# Patient Record
Sex: Male | Born: 1945
Health system: Southern US, Community
[De-identification: ages and names within clinical notes are randomized; demographics above are authoritative.]

## PROBLEM LIST (undated history)

## (undated) DIAGNOSIS — E7800 Pure hypercholesterolemia, unspecified: Secondary | ICD-10-CM

## (undated) DIAGNOSIS — I7409 Other arterial embolism and thrombosis of abdominal aorta: Secondary | ICD-10-CM

## (undated) DIAGNOSIS — J189 Pneumonia, unspecified organism: Secondary | ICD-10-CM

## (undated) DIAGNOSIS — I10 Essential (primary) hypertension: Secondary | ICD-10-CM

## (undated) DIAGNOSIS — I739 Peripheral vascular disease, unspecified: Secondary | ICD-10-CM

## (undated) DIAGNOSIS — J869 Pyothorax without fistula: Secondary | ICD-10-CM

## (undated) DIAGNOSIS — E785 Hyperlipidemia, unspecified: Secondary | ICD-10-CM

## (undated) DIAGNOSIS — J449 Chronic obstructive pulmonary disease, unspecified: Secondary | ICD-10-CM

## (undated) DIAGNOSIS — I779 Disorder of arteries and arterioles, unspecified: Secondary | ICD-10-CM

## (undated) DIAGNOSIS — Z720 Tobacco use: Secondary | ICD-10-CM

## (undated) DIAGNOSIS — I2510 Atherosclerotic heart disease of native coronary artery without angina pectoris: Secondary | ICD-10-CM

## (undated) HISTORY — DX: Disorder of arteries and arterioles, unspecified: I77.9

## (undated) HISTORY — DX: Tobacco use: Z72.0

## (undated) HISTORY — PX: OTHER SURGICAL HISTORY: SHX169

## (undated) HISTORY — PX: DOPPLER ECHOCARDIOGRAPHY: SHX263

## (undated) HISTORY — DX: Hyperlipidemia, unspecified: E78.5

## (undated) HISTORY — PX: HERNIA REPAIR: SHX51

## (undated) HISTORY — PX: COLONOSCOPY W/ POLYPECTOMY: SHX1380

## (undated) HISTORY — DX: Pure hypercholesterolemia, unspecified: E78.00

## (undated) HISTORY — DX: Essential (primary) hypertension: I10

## (undated) HISTORY — DX: Other arterial embolism and thrombosis of abdominal aorta: I74.09

## (undated) HISTORY — PX: TONSILLECTOMY AND ADENOIDECTOMY: SUR1326

## (undated) HISTORY — DX: Peripheral vascular disease, unspecified: I73.9

---

## 2010-03-23 HISTORY — PX: NM MYOVIEW LTD: HXRAD82

## 2011-02-01 ENCOUNTER — Ambulatory Visit
Admission: RE | Admit: 2011-02-01 | Discharge: 2011-02-01 | Disposition: A | Discharge status: Home / Self Care | Payer: Medicare Other | Source: Ambulatory Visit | Attending: Family Medicine | Admitting: Family Medicine

## 2011-02-01 ENCOUNTER — Other Ambulatory Visit: Payer: Self-pay | Admitting: Family Medicine

## 2011-02-01 DIAGNOSIS — R42 Dizziness and giddiness: Secondary | ICD-10-CM

## 2011-02-01 DIAGNOSIS — R270 Ataxia, unspecified: Secondary | ICD-10-CM

## 2013-01-27 ENCOUNTER — Other Ambulatory Visit (HOSPITAL_COMMUNITY): Payer: Self-pay | Admitting: Cardiovascular Disease

## 2013-01-27 DIAGNOSIS — I6529 Occlusion and stenosis of unspecified carotid artery: Secondary | ICD-10-CM

## 2013-02-04 ENCOUNTER — Ambulatory Visit (HOSPITAL_COMMUNITY)
Admission: RE | Admit: 2013-02-04 | Discharge: 2013-02-04 | Disposition: A | Discharge status: Home / Self Care | Payer: Medicare Other | Source: Ambulatory Visit | Attending: Cardiology | Admitting: Cardiology

## 2013-02-04 DIAGNOSIS — I6529 Occlusion and stenosis of unspecified carotid artery: Secondary | ICD-10-CM | POA: Insufficient documentation

## 2013-02-04 NOTE — Progress Notes (Signed)
Carotid Duplex Completed. °Brianna L Mazza,RVT °

## 2013-02-14 ENCOUNTER — Encounter: Payer: Self-pay | Admitting: *Deleted

## 2013-02-14 ENCOUNTER — Telehealth: Payer: Self-pay | Admitting: *Deleted

## 2013-02-14 DIAGNOSIS — I6529 Occlusion and stenosis of unspecified carotid artery: Secondary | ICD-10-CM

## 2013-02-14 NOTE — Telephone Encounter (Signed)
Order placed for repeat carotid dopplers in 1 year  

## 2013-02-14 NOTE — Telephone Encounter (Signed)
Message copied by Marella Bile on Sat Feb 14, 2013  1:27 PM ------      Message from: Runell Gess      Created: Wed Feb 11, 2013  8:40 PM       No change from prior study. Repeat in 12 months. ------

## 2013-03-25 ENCOUNTER — Ambulatory Visit: Payer: Medicare Other | Admitting: Cardiovascular Disease

## 2013-04-09 ENCOUNTER — Encounter: Payer: Self-pay | Admitting: Cardiovascular Disease

## 2013-04-09 ENCOUNTER — Ambulatory Visit (INDEPENDENT_AMBULATORY_CARE_PROVIDER_SITE_OTHER): Payer: Medicare Other | Admitting: Cardiovascular Disease

## 2013-04-09 VITALS — BP 96/70 | HR 84 | Ht 74.0 in | Wt 201.0 lb

## 2013-04-09 DIAGNOSIS — E785 Hyperlipidemia, unspecified: Secondary | ICD-10-CM

## 2013-04-09 DIAGNOSIS — I10 Essential (primary) hypertension: Secondary | ICD-10-CM

## 2013-04-09 DIAGNOSIS — Z720 Tobacco use: Secondary | ICD-10-CM

## 2013-04-09 DIAGNOSIS — F17210 Nicotine dependence, cigarettes, uncomplicated: Secondary | ICD-10-CM | POA: Insufficient documentation

## 2013-04-09 DIAGNOSIS — I779 Disorder of arteries and arterioles, unspecified: Secondary | ICD-10-CM | POA: Insufficient documentation

## 2013-04-09 DIAGNOSIS — Z79899 Other long term (current) drug therapy: Secondary | ICD-10-CM

## 2013-04-09 DIAGNOSIS — F17200 Nicotine dependence, unspecified, uncomplicated: Secondary | ICD-10-CM

## 2013-04-09 NOTE — Assessment & Plan Note (Signed)
On statin therapy. We'll recheck a lipid and liver profile 

## 2013-04-09 NOTE — Progress Notes (Signed)
04/09/2013 Brent Gonzales   January 25, 1946  [id number]  Primary Physician Brent Grosser, MD Primary Cardiologist: Brent Gess MD Brent Gonzales   HPI: The patient is a 67 year old, thin-appearing, married Caucasian male with no children who I last saw a year ago. He has a history of continued tobacco abuse 1/2 pack per day although he is trying to cut this down and has transitioned to an E. cigarette, treated hypertension and hyperlipidemia. He has moderate carotid disease with a right ICA stenosis that has remained stable by ultrasound as recently as July. He also has mild bilateral external iliac disease by duplex ultrasound with ABIs in the 0.9 range, though, he denies claudication. Myoview performed December 2011 showed inferior septal ischemia that we decided to treat conservatively since he is asymptomatic and Dr. Tanya Gonzales follows his lipid profile. He has had not chest or shortness of breath since I last saw him.    Current Outpatient Prescriptions  Medication Sig Dispense Refill  . aspirin 81 MG tablet Take 81 mg by mouth daily.      . Cholecalciferol (VITAMIN D) 2000 UNITS CAPS Take 2,000 Units by mouth daily.      . clopidogrel (PLAVIX) 75 MG tablet Take 75 mg by mouth daily with breakfast.      . Coenzyme Q10 (CO Q10) 100 MG CAPS Take 200 mg by mouth daily.      Marland Kitchen FLUZONE HIGH-DOSE injection       . glucosamine-chondroitin 500-400 MG tablet Take 1 tablet by mouth 2 (two) times daily.      . Multiple Vitamin (MULTIVITAMIN) capsule Take 1 capsule by mouth daily.      . Omega-3 Fatty Acids (FISH OIL CONCENTRATE PO) Take 1,000 mg by mouth 2 (two) times daily.      . pravastatin (PRAVACHOL) 40 MG tablet Take 40 mg by mouth daily.       No current facility-administered medications for this visit.    Allergies  Allergen Reactions  . Penicillins     History   Social History  . Marital Status: Married    Spouse Name: N/A    Number of Children: N/A  . Years  of Education: N/A   Occupational History  . Not on file.   Social History Main Topics  . Smoking status: Current Every Day Smoker    Types: Cigarettes  . Smokeless tobacco: Not on file     Comment: weaning off cigs and using the e cig  . Alcohol Use: Not on file  . Drug Use: Not on file  . Sexual Activity: Not on file   Other Topics Concern  . Not on file   Social History Narrative  . No narrative on file     Review of Systems: General: negative for chills, fever, night sweats or weight changes.  Cardiovascular: negative for chest pain, dyspnea on exertion, edema, orthopnea, palpitations, paroxysmal nocturnal dyspnea or shortness of breath Dermatological: negative for rash Respiratory: negative for cough or wheezing Urologic: negative for hematuria Abdominal: negative for nausea, vomiting, diarrhea, bright red blood per rectum, melena, or hematemesis Neurologic: negative for visual changes, syncope, or dizziness All other systems reviewed and are otherwise negative except as noted above.    Blood pressure 96/70, pulse 84, height 6\' 2"  (1.88 m), weight 201 lb (91.173 kg).  General appearance: alert and no distress Neck: no adenopathy, no JVD, supple, symmetrical, trachea midline, thyroid not enlarged, symmetric, no tenderness/mass/nodules and soft right carotid bruit Lungs: clear to  auscultation bilaterally Heart: regular rate and rhythm, S1, S2 normal, no murmur, click, rub or gallop Extremities: extremities normal, atraumatic, no cyanosis or edema  EKG normal sinus rhythm at 84 without ST or T wave changes.  ASSESSMENT AND PLAN:   Carotid artery disease Followed by duplex ultrasound in our office. This was most recently performed 02/04/13 revealing moderate right and mild left ICA stenosis unchanged from the prior year. He is neurologically asymptomatic. We will continue to follow this on an annual basis.  Essential hypertension Controlled on current  medications  Hyperlipidemia On statin therapy. We'll recheck a lipid and liver profile      Brent Gess MD [place], [facility] 04/09/2013 10:13 AM Brent Gess MD FACP,FACC,FAHA, [facility]

## 2013-04-09 NOTE — Assessment & Plan Note (Signed)
Controlled on current medications 

## 2013-04-09 NOTE — Patient Instructions (Signed)
  We will see you back in follow up in 1 year with Dr Berry  Dr Berry has ordered blood work to be done fasting.    

## 2013-04-09 NOTE — Assessment & Plan Note (Signed)
Followed by duplex ultrasound in our office. This was most recently performed 02/04/13 revealing moderate right and mild left ICA stenosis unchanged from the prior year. He is neurologically asymptomatic. We will continue to follow this on an annual basis.

## 2013-04-10 ENCOUNTER — Encounter: Payer: Self-pay | Admitting: Cardiovascular Disease

## 2013-05-13 ENCOUNTER — Other Ambulatory Visit: Payer: Self-pay | Admitting: Cardiovascular Disease

## 2013-05-18 ENCOUNTER — Other Ambulatory Visit: Payer: Medicare Other

## 2013-05-18 ENCOUNTER — Other Ambulatory Visit: Payer: Self-pay | Admitting: *Deleted

## 2013-05-18 LAB — HEPATIC FUNCTION PANEL
ALT: 31 U/L (ref 0–53)
AST: 28 U/L (ref 0–37)
Albumin: 4.4 g/dL (ref 3.5–5.2)
Alkaline Phosphatase: 54 U/L (ref 39–117)
Bilirubin, Direct: 0.1 mg/dL (ref 0.0–0.3)
Indirect Bilirubin: 0.4 mg/dL (ref 0.0–0.9)
Total Bilirubin: 0.5 mg/dL (ref 0.3–1.2)
Total Protein: 7.2 g/dL (ref 6.0–8.3)

## 2013-05-18 LAB — LIPID PANEL
Cholesterol: 154 mg/dL (ref 0–200)
HDL: 39 mg/dL — ABNORMAL LOW (ref >39)
LDL Cholesterol: 81 mg/dL (ref 0–99)
Total CHOL/HDL Ratio: 3.9 Ratio
Triglycerides: 168 mg/dL — ABNORMAL HIGH (ref <150)
VLDL: 34 mg/dL (ref 0–40)

## 2013-05-18 MED ORDER — CLOPIDOGREL BISULFATE 75 MG PO TABS: 75.0000 mg | ORAL_TABLET | Freq: Every day | ORAL | Status: DC

## 2013-05-18 NOTE — Telephone Encounter (Signed)
Rx was sent to pharmacy electronically. 

## 2013-05-20 ENCOUNTER — Encounter: Payer: Self-pay | Admitting: *Deleted

## 2013-05-25 ENCOUNTER — Ambulatory Visit (INDEPENDENT_AMBULATORY_CARE_PROVIDER_SITE_OTHER): Payer: Medicare Other | Admitting: Family Medicine

## 2013-05-25 ENCOUNTER — Encounter: Payer: Self-pay | Admitting: Family Medicine

## 2013-05-25 VITALS — BP 100/62 | HR 78 | Temp 98.7°F | Resp 16 | Ht 74.0 in | Wt 206.0 lb

## 2013-05-25 DIAGNOSIS — Z23 Encounter for immunization: Secondary | ICD-10-CM

## 2013-05-25 DIAGNOSIS — Z0000 Encounter for general adult medical examination without abnormal findings: Secondary | ICD-10-CM

## 2013-05-25 DIAGNOSIS — H269 Unspecified cataract: Secondary | ICD-10-CM

## 2013-05-25 MED ORDER — ATORVASTATIN CALCIUM 40 MG PO TABS: 40.0000 mg | ORAL_TABLET | Freq: Every day | ORAL | Status: DC

## 2013-05-25 NOTE — Progress Notes (Signed)
Subjective:    Patient ID: Brent Gonzales, male    DOB: [birth year], 68 y.o.   MRN: [id number]  HPI  Patient is a very pleasant 68 year old Caucasian male who comes in today for complete physical exam. Past medical history is significant for ASCVD. He had a positive Myoview showing septal ischemia in 2011. The patient is currently asymptomatic and we have elected to pursue medical management. He continues to deny chest pain or dyspnea on exertion. Unfortunately continues to smoke. His most recent fasting lipid panel revealed an LDL greater than 80. His goal LDL be less than 70. He is currently taking pravastatin 40 mg by mouth daily without any side effects. He also complains of blurry vision in his left eye. It is worse at night. He is also having problems with distance vision. Objects are very blurry in the left eye. Examination today reveals a probable cataract. As his last colonoscopy was in 2012. He has history of colon polyps. He is next due for colonoscopy in 2017. Patient has had Pneumovax in 2010. He bone density in 2009. He has had Zostavax. He is due for a tetanus vaccine but declines it based on insurance coverage. He is also due for Prevnar 13. Past Medical History  Diagnosis Date  . Hyperlipidemia   . Tobacco abuse   . Carotid artery disease   . Hypertension    No past surgical history on file. Past surgical history is significant for hernia repair in 1958 and 1978 Current Outpatient Prescriptions on File Prior to Visit  Medication Sig Dispense Refill  . aspirin 81 MG tablet Take 81 mg by mouth daily.      . Cholecalciferol (VITAMIN D) 2000 UNITS CAPS Take 2,000 Units by mouth daily.      . clopidogrel (PLAVIX) 75 MG tablet Take 1 tablet (75 mg total) by mouth daily with breakfast.  360 tablet  0  . Coenzyme Q10 (CO Q10) 100 MG CAPS Take 200 mg by mouth daily.      Marland Kitchen glucosamine-chondroitin 500-400 MG tablet Take 1 tablet by mouth 2 (two) times daily.      . Multiple Vitamin  (MULTIVITAMIN) capsule Take 1 capsule by mouth daily.      . Omega-3 Fatty Acids (FISH OIL CONCENTRATE PO) Take 1,000 mg by mouth 2 (two) times daily.       No current facility-administered medications on file prior to visit.   Allergies  Allergen Reactions  . Penicillins    History   Social History  . Marital Status: Married    Spouse Name: N/A    Number of Children: N/A  . Years of Education: N/A   Occupational History  . Not on file.   Social History Main Topics  . Smoking status: Current Every Day Smoker    Types: Cigarettes  . Smokeless tobacco: Not on file     Comment: weaning off cigs and using the e cig  . Alcohol Use: Not on file  . Drug Use: Not on file  . Sexual Activity: Not on file   Other Topics Concern  . Not on file   Social History Narrative  . No narrative on file   No family history on file.   Review of Systems  All other systems reviewed and are negative.       Objective:   Physical Exam  Vitals reviewed. Constitutional: He is oriented to person, place, and time. He appears well-developed and well-nourished. No distress.  HENT:  Head: Normocephalic  and atraumatic.  Right Ear: External ear normal.  Left Ear: External ear normal.  Nose: Nose normal.  Mouth/Throat: Oropharynx is clear and moist. No oropharyngeal exudate.  Eyes: Conjunctivae and EOM are normal. Pupils are equal, round, and reactive to light. Right eye exhibits no discharge. Left eye exhibits no discharge. No scleral icterus.  Neck: Normal range of motion. Neck supple. No JVD present. No tracheal deviation present. No thyromegaly present.  Cardiovascular: Normal rate, regular rhythm and intact distal pulses.  Exam reveals no gallop and no friction rub.   No murmur heard. Pulmonary/Chest: Effort normal and breath sounds normal. No stridor. No respiratory distress. He has no wheezes. He has no rales. He exhibits no tenderness.  Abdominal: Soft. Bowel sounds are normal. He  exhibits no distension and no mass. There is no tenderness. There is no rebound and no guarding.  Genitourinary: Rectum normal, prostate normal and penis normal. Guaiac negative stool. No penile tenderness.  Musculoskeletal: Normal range of motion. He exhibits no edema and no tenderness.  Lymphadenopathy:    He has no cervical adenopathy.  Neurological: He is alert and oriented to person, place, and time. He has normal reflexes. He displays normal reflexes. No cranial nerve deficit. He exhibits normal muscle tone. Coordination normal.  Skin: Skin is warm. No rash noted. He is not diaphoretic. No erythema. No pallor.  Psychiatric: He has a normal mood and affect. His behavior is normal. Judgment and thought content normal.          Assessment & Plan:  1. Routine general medical examination at a health care facility I will check a CBC and BMP and PSA. His other cancer screening is up to date. The patient received Prevnar 13 today in the office. I did recommend discontinuing pravastatin and replacing it with Lipitor 40 mg by mouth daily to try to get his goal LDL below 70.  I also recommended smoking cessation. I may try the patient on trying to cut back on his cigarette use. Discussed the risk of nicotine and cardiovascular disease. - CBC with Differential - BASIC METABOLIC PANEL WITH GFR - PSA, Medicare  2. Left cataract I will consult ophthalmology regarding the left eye cataract. - Ambulatory referral to Ophthalmology

## 2013-05-25 NOTE — Addendum Note (Signed)
Addended by: Shary Decamp B on: 05/25/2013 05:23 PM   Modules accepted: Orders

## 2013-05-26 LAB — BASIC METABOLIC PANEL WITH GFR
BUN: 19 mg/dL (ref 6–23)
CO2: 23 meq/L (ref 19–32)
Calcium: 10.2 mg/dL (ref 8.4–10.5)
Chloride: 102 mEq/L (ref 96–112)
Creat: 1.12 mg/dL (ref 0.50–1.35)
GFR, Est African American: 78 mL/min
GFR, Est Non African American: 67 mL/min
GLUCOSE: 89 mg/dL (ref 70–99)
POTASSIUM: 4.4 meq/L (ref 3.5–5.3)
SODIUM: 140 meq/L (ref 135–145)

## 2013-05-26 LAB — CBC WITH DIFFERENTIAL/PLATELET
Basophils Absolute: 0 10*3/uL (ref 0.0–0.1)
Basophils Relative: 0 % (ref 0–1)
Eosinophils Absolute: 0.3 10*3/uL (ref 0.0–0.7)
Eosinophils Relative: 4 % (ref 0–5)
HCT: 46.8 % (ref 39.0–52.0)
HEMOGLOBIN: 16.4 g/dL (ref 13.0–17.0)
LYMPHS ABS: 2.1 10*3/uL (ref 0.7–4.0)
Lymphocytes Relative: 24 % (ref 12–46)
MCH: 33 pg (ref 26.0–34.0)
MCHC: 35 g/dL (ref 30.0–36.0)
MCV: 94.2 fL (ref 78.0–100.0)
MONOS PCT: 7 % (ref 3–12)
Monocytes Absolute: 0.6 10*3/uL (ref 0.1–1.0)
NEUTROS ABS: 5.7 10*3/uL (ref 1.7–7.7)
NEUTROS PCT: 65 % (ref 43–77)
Platelets: 281 10*3/uL (ref 150–400)
RBC: 4.97 MIL/uL (ref 4.22–5.81)
RDW: 13.2 % (ref 11.5–15.5)
WBC: 8.8 10*3/uL (ref 4.0–10.5)

## 2013-05-26 LAB — PSA, MEDICARE: PSA: 1.34 ng/mL (ref <=4.00)

## 2013-05-29 ENCOUNTER — Telehealth: Payer: Self-pay | Admitting: Cardiovascular Disease

## 2013-05-29 NOTE — Telephone Encounter (Signed)
Yes, switch to lipitor

## 2013-05-29 NOTE — Telephone Encounter (Signed)
Just had received the results from his lab work and has some questions .Marland Kitchen Please Call  Thanks

## 2013-05-29 NOTE — Telephone Encounter (Signed)
Returned call and pt verified x 2.  Pt stated he received the report of his blood work.  Stated he saw his PCP this week and he reviewed his labs.  Stated he told pt that although his LDL is good, he would rather see it around 70 and switched him from pravastatin to Lipitor.  Pt wants to know if that's okay.  Pt informed Dr. Gwenlyn Found will be notified for further instructions.  Pt verbalized understanding and agreed w/ plan.    Pt was told to complete pravastatin and start Lipitor once done.  Just refilled last week for #30.  Message forwarded to K. Donne Hazel, RN to discuss w/ Dr. Gwenlyn Found.

## 2013-06-01 NOTE — Telephone Encounter (Signed)
Returned call.  Left message on pt-identified voicemail that Dr. Gwenlyn Found said it is okay to switch to Lipitor and to call back before 4pm if questions.

## 2013-07-10 ENCOUNTER — Encounter: Payer: Self-pay | Admitting: Family Medicine

## 2013-07-10 ENCOUNTER — Ambulatory Visit (INDEPENDENT_AMBULATORY_CARE_PROVIDER_SITE_OTHER): Payer: Medicare Other | Admitting: Family Medicine

## 2013-07-10 VITALS — BP 110/68 | HR 78 | Temp 97.8°F | Resp 20 | Ht 74.0 in | Wt 207.0 lb

## 2013-07-10 DIAGNOSIS — K5909 Other constipation: Secondary | ICD-10-CM

## 2013-07-10 DIAGNOSIS — K5900 Constipation, unspecified: Secondary | ICD-10-CM

## 2013-07-10 NOTE — Progress Notes (Signed)
Subjective:    Patient ID: Brent Gonzales, male    DOB: 04/29/1945, 68 y.o.   MRN: [id number]  HPI Saw the patient February 2. Patient states that beginning 3 days prior to that he developed constipation. He has had constipation ever since. He is not having a bowel movement unless he takes some type of laxative. If he takes a laxative he will have 4-5 "squirts" but no formed, solid bowel movement. He reports abdominal bloating and weight gain. He denies nausea or vomiting. He denies melena or hematochezia. He denies any recent change in medication. His diet has changed as he is trying to consume more fruits.  He does drink plenty of fluids throughout the day. He denies any rectal pain. He denies any fevers or chills.  Past Medical History  Diagnosis Date  . Hyperlipidemia   . Tobacco abuse   . Carotid artery disease   . Hypertension    Current Outpatient Prescriptions on File Prior to Visit  Medication Sig Dispense Refill  . aspirin 81 MG tablet Take 81 mg by mouth daily.      Marland Kitchen atorvastatin (LIPITOR) 40 MG tablet Take 1 tablet (40 mg total) by mouth daily.  90 tablet  3  . Cholecalciferol (VITAMIN D) 2000 UNITS CAPS Take 2,000 Units by mouth daily.      . clopidogrel (PLAVIX) 75 MG tablet Take 1 tablet (75 mg total) by mouth daily with breakfast.  360 tablet  0  . Coenzyme Q10 (CO Q10) 100 MG CAPS Take 200 mg by mouth daily.      Marland Kitchen glucosamine-chondroitin 500-400 MG tablet Take 1 tablet by mouth 2 (two) times daily.      . Multiple Vitamin (MULTIVITAMIN) capsule Take 1 capsule by mouth daily.      . Omega-3 Fatty Acids (FISH OIL CONCENTRATE PO) Take 1,000 mg by mouth 2 (two) times daily.       No current facility-administered medications on file prior to visit.   Allergies  Allergen Reactions  . Penicillins    History   Social History  . Marital Status: Married    Spouse Name: N/A    Number of Children: N/A  . Years of Education: N/A   Occupational History  . Not on file.    Social History Main Topics  . Smoking status: Current Every Day Smoker    Types: Cigarettes  . Smokeless tobacco: Not on file     Comment: weaning off cigs and using the e cig  . Alcohol Use: Not on file  . Drug Use: Not on file  . Sexual Activity: Not on file   Other Topics Concern  . Not on file   Social History Narrative  . No narrative on file      Review of Systems  All other systems reviewed and are negative.       Objective:   Physical Exam  Vitals reviewed. Cardiovascular: Normal rate, regular rhythm and normal heart sounds.   No murmur heard. Pulmonary/Chest: Effort normal and breath sounds normal.  Abdominal: Soft. He exhibits distension. He exhibits no mass. There is no tenderness. There is no rebound and no guarding.  Genitourinary: Rectum normal and prostate normal.  There is no impaction on rectal exam.        Assessment & Plan:  1. Chronic constipation Obtain an x-ray of the abdomen to rule out impaction at a higher level. If there is no sign of impaction I will await the patient's TSH. If  he has no evidence of hypothyroidism I will treat him as chronic constipation. I gave the patient Linzess 145 mcg poqday.  If this does not help I recommend using GoLYTELY to try to "clean him out."  If no better after that, recommend GI consult. - TSH - DG Abd 2 Views; Future

## 2013-07-11 LAB — TSH: TSH: 1.732 u[IU]/mL (ref 0.350–4.500)

## 2013-07-13 ENCOUNTER — Ambulatory Visit
Admission: RE | Admit: 2013-07-13 | Discharge: 2013-07-13 | Disposition: A | Discharge status: Home / Self Care | Payer: Medicare Other | Source: Ambulatory Visit | Attending: Family Medicine | Admitting: Family Medicine

## 2013-07-13 DIAGNOSIS — K5909 Other constipation: Secondary | ICD-10-CM

## 2013-07-14 ENCOUNTER — Encounter: Payer: Self-pay | Admitting: Family Medicine

## 2013-08-27 ENCOUNTER — Ambulatory Visit (INDEPENDENT_AMBULATORY_CARE_PROVIDER_SITE_OTHER): Payer: Medicare Other | Admitting: Family Medicine

## 2013-08-27 ENCOUNTER — Encounter: Payer: Self-pay | Admitting: Family Medicine

## 2013-08-27 VITALS — BP 134/78 | HR 76 | Temp 98.0°F | Resp 18 | Ht 73.23 in | Wt 204.0 lb

## 2013-08-27 DIAGNOSIS — D485 Neoplasm of uncertain behavior of skin: Secondary | ICD-10-CM

## 2013-08-27 NOTE — Progress Notes (Signed)
   Subjective:    Patient ID: Brent Gonzales, male    DOB: 07-20-1945, 68 y.o.   MRN: [id number]  HPI  Patient has noticed 2 lesions on the dorsum of his left forearm for several months. One is 5 mm in size. It is an erythematous scaly papule on the dorsum of his left forearm. Just distal to that is a purple clear macule with surface telangiectasias and early iridescence. He also has a slight ulceration in the center. Neither lesion is going away. Past Medical History  Diagnosis Date  . Hyperlipidemia   . Tobacco abuse   . Carotid artery disease   . Hypertension    Current Outpatient Prescriptions on File Prior to Visit  Medication Sig Dispense Refill  . aspirin 81 MG tablet Take 81 mg by mouth daily.      Marland Kitchen atorvastatin (LIPITOR) 40 MG tablet Take 1 tablet (40 mg total) by mouth daily.  90 tablet  3  . Cholecalciferol (VITAMIN D) 2000 UNITS CAPS Take 2,000 Units by mouth daily.      . clopidogrel (PLAVIX) 75 MG tablet Take 1 tablet (75 mg total) by mouth daily with breakfast.  360 tablet  0  . Coenzyme Q10 (CO Q10) 100 MG CAPS Take 200 mg by mouth daily.      Marland Kitchen glucosamine-chondroitin 500-400 MG tablet Take 1 tablet by mouth 2 (two) times daily.      . Multiple Vitamin (MULTIVITAMIN) capsule Take 1 capsule by mouth daily.      . Omega-3 Fatty Acids (FISH OIL CONCENTRATE PO) Take 1,000 mg by mouth 2 (two) times daily.       No current facility-administered medications on file prior to visit.   Allergies  Allergen Reactions  . Penicillins    History   Social History  . Marital Status: Married    Spouse Name: N/A    Number of Children: N/A  . Years of Education: N/A   Occupational History  . Not on file.   Social History Main Topics  . Smoking status: Current Every Day Smoker    Types: Cigarettes  . Smokeless tobacco: Never Used     Comment: weaning off cigs and using the e cig  . Alcohol Use: 0.6 oz/week    1 Cans of beer per week  . Drug Use: No  . Sexual Activity: Yes    Other Topics Concern  . Not on file   Social History Narrative  . No narrative on file     Review of Systems  All other systems reviewed and are negative.      Objective:   Physical Exam  Vitals reviewed. Cardiovascular: Normal rate and regular rhythm.   Pulmonary/Chest: Effort normal and breath sounds normal.          Assessment & Plan:  1. Neoplasm of uncertain behavior of skin The erythematous scaly papule proximally located on the surface of his left forearm was treated with liquid nitrogen using cryotherapy for a total of 30 seconds. If the lesion does not completely resolve, the patient is to return for a shave biopsy.  The second lesion, the purple ulcerated macule was anesthetized with 0.1% lidocaine with epinephrine. Shave biopsy was performed and the lesion was sent to pathology and labeled container. Hemostasis was achieved with Drysol and a Band-Aid. - Pathology

## 2013-08-31 ENCOUNTER — Encounter: Payer: Self-pay | Admitting: Family Medicine

## 2013-08-31 LAB — PATHOLOGY

## 2013-09-23 ENCOUNTER — Encounter (INDEPENDENT_AMBULATORY_CARE_PROVIDER_SITE_OTHER): Payer: Medicare Other | Admitting: Ophthalmology

## 2013-09-23 DIAGNOSIS — I10 Essential (primary) hypertension: Secondary | ICD-10-CM

## 2013-09-23 DIAGNOSIS — H35379 Puckering of macula, unspecified eye: Secondary | ICD-10-CM

## 2013-09-23 DIAGNOSIS — H43819 Vitreous degeneration, unspecified eye: Secondary | ICD-10-CM

## 2013-09-23 DIAGNOSIS — H2510 Age-related nuclear cataract, unspecified eye: Secondary | ICD-10-CM

## 2013-09-23 DIAGNOSIS — H35039 Hypertensive retinopathy, unspecified eye: Secondary | ICD-10-CM

## 2014-01-19 ENCOUNTER — Encounter (HOSPITAL_COMMUNITY): Payer: Self-pay | Admitting: *Deleted

## 2014-01-19 ENCOUNTER — Telehealth (HOSPITAL_COMMUNITY): Payer: Self-pay | Admitting: *Deleted

## 2014-02-17 ENCOUNTER — Encounter (HOSPITAL_COMMUNITY): Payer: Medicare Other

## 2014-03-10 ENCOUNTER — Ambulatory Visit (HOSPITAL_COMMUNITY)
Admission: RE | Admit: 2014-03-10 | Discharge: 2014-03-10 | Disposition: A | Discharge status: Home / Self Care | Payer: Medicare Other | Source: Ambulatory Visit | Attending: Cardiovascular Disease | Admitting: Cardiovascular Disease

## 2014-03-10 DIAGNOSIS — I6529 Occlusion and stenosis of unspecified carotid artery: Secondary | ICD-10-CM | POA: Insufficient documentation

## 2014-03-10 NOTE — Progress Notes (Signed)
Carotid Duplex Completed. °Brianna L Mazza,RVT °

## 2014-03-16 ENCOUNTER — Encounter: Payer: Self-pay | Admitting: *Deleted

## 2014-03-24 ENCOUNTER — Ambulatory Visit (INDEPENDENT_AMBULATORY_CARE_PROVIDER_SITE_OTHER): Payer: Medicare Other | Admitting: Cardiovascular Disease

## 2014-03-24 ENCOUNTER — Encounter: Payer: Self-pay | Admitting: Cardiovascular Disease

## 2014-03-24 VITALS — BP 134/90 | HR 78 | Ht 74.0 in | Wt 202.6 lb

## 2014-03-24 DIAGNOSIS — I2510 Atherosclerotic heart disease of native coronary artery without angina pectoris: Secondary | ICD-10-CM

## 2014-03-24 DIAGNOSIS — Z79899 Other long term (current) drug therapy: Secondary | ICD-10-CM

## 2014-03-24 DIAGNOSIS — I779 Disorder of arteries and arterioles, unspecified: Secondary | ICD-10-CM

## 2014-03-24 DIAGNOSIS — I10 Essential (primary) hypertension: Secondary | ICD-10-CM

## 2014-03-24 DIAGNOSIS — Z720 Tobacco use: Secondary | ICD-10-CM

## 2014-03-24 DIAGNOSIS — I739 Peripheral vascular disease, unspecified: Secondary | ICD-10-CM

## 2014-03-24 DIAGNOSIS — E785 Hyperlipidemia, unspecified: Secondary | ICD-10-CM

## 2014-03-24 NOTE — Progress Notes (Signed)
03/24/2014 Brent Gonzales   09-04-1945  [id number]  Primary Physician Odette Fraction, MD Primary Cardiologist: Lorretta Harp MD Renae Gloss   HPI:  The patient is a 68 year old, thin-appearing, married Caucasian male father to 3 stepchildren who I last saw a year ago. He has a history of continued tobacco abuse 1/2 pack per day although he is trying to cut this down and has transitioned to an E. cigarette, treated hypertension and hyperlipidemia. He has moderate carotid disease with a right ICA stenosis that has remained stable by ultrasound as recently as July. He also has mild bilateral external iliac disease by duplex ultrasound with ABIs in the 0.9 range, though, he denies claudication but does complain of some left hip pain.. Myoview performed December 2011 showed inferior septal ischemia that we decided to treat conservatively since he is asymptomatic and Dr. Dennard Schaumann follows his lipid profile. He has had not chest or shortness of breath since I last saw him.   Current Outpatient Prescriptions  Medication Sig Dispense Refill  . aspirin 81 MG tablet Take 81 mg by mouth daily.    Marland Kitchen atorvastatin (LIPITOR) 40 MG tablet Take 1 tablet (40 mg total) by mouth daily. 90 tablet 3  . Cholecalciferol (VITAMIN D) 2000 UNITS CAPS Take 2,000 Units by mouth daily.    . clopidogrel (PLAVIX) 75 MG tablet Take 1 tablet (75 mg total) by mouth daily with breakfast. 360 tablet 0  . Coenzyme Q10 (CO Q10) 100 MG CAPS Take 200 mg by mouth daily.    Marland Kitchen glucosamine-chondroitin 500-400 MG tablet Take 1 tablet by mouth 2 (two) times daily.    Marland Kitchen KRILL OIL PO Take 2 capsules by mouth daily.    . Multiple Vitamin (MULTIVITAMIN) capsule Take 1 capsule by mouth daily.     No current facility-administered medications for this visit.    Allergies  Allergen Reactions  . Penicillins     History   Social History  . Marital Status: Married    Spouse Name: N/A    Number of Children: N/A  .  Years of Education: N/A   Occupational History  . Not on file.   Social History Main Topics  . Smoking status: Current Every Day Smoker    Types: Cigarettes  . Smokeless tobacco: Never Used     Comment: weaning off cigs and using the e cig  . Alcohol Use: 0.6 oz/week    1 Cans of beer per week  . Drug Use: No  . Sexual Activity: Yes   Other Topics Concern  . Not on file   Social History Narrative     Review of Systems: General: negative for chills, fever, night sweats or weight changes.  Cardiovascular: negative for chest pain, dyspnea on exertion, edema, orthopnea, palpitations, paroxysmal nocturnal dyspnea or shortness of breath Dermatological: negative for rash Respiratory: negative for cough or wheezing Urologic: negative for hematuria Abdominal: negative for nausea, vomiting, diarrhea, bright red blood per rectum, melena, or hematemesis Neurologic: negative for visual changes, syncope, or dizziness All other systems reviewed and are otherwise negative except as noted above.    Blood pressure 134/90, pulse 78, height 6\' 2"  (1.88 m), weight 202 lb 9.6 oz (91.899 kg).  General appearance: alert and no distress Neck: no adenopathy, no JVD, supple, symmetrical, trachea midline, thyroid not enlarged, symmetric, no tenderness/mass/nodules and soft bilateral carotid bruits Lungs: clear to auscultation bilaterally Heart: regular rate and rhythm, S1, S2 normal, no murmur, click, rub or gallop Extremities:  extremities normal, atraumatic, no cyanosis or edema and 2+ femorals with soft bruits bilaterally  EKG normal sinus rhythm at 78 without ST or T-wave changes. I personally reviewed this EKG  ASSESSMENT AND PLAN:   Essential hypertension History of hypertension with blood pressure measured today 134/90. He is not on antihypertensive medications.  Carotid artery disease History of moderate right and mild left ICA stenosis which has remained stable by recent duplex ultrasound  performed 03/10/14. We will continue annual Doppler surveillance  Hyperlipidemia History of hyperlipidemia on atorvastatin 40 mg a day. We will recheck a lipid and liver profile  Tobacco abuse Continued tobacco abuse at one half pack per day recalcitrant to risk factor modification      Lorretta Harp MD [facility], [facility] 03/24/2014 9:58 AM

## 2014-03-24 NOTE — Assessment & Plan Note (Signed)
Continued tobacco abuse at one half pack per day recalcitrant to risk factor modification

## 2014-03-24 NOTE — Assessment & Plan Note (Signed)
History of hypertension with blood pressure measured today 134/90. He is not on antihypertensive medications.

## 2014-03-24 NOTE — Assessment & Plan Note (Signed)
History of moderate right and mild left ICA stenosis which has remained stable by recent duplex ultrasound performed 03/10/14. We will continue annual Doppler surveillance

## 2014-03-24 NOTE — Patient Instructions (Signed)
Dr. Gwenlyn Found has ordered for you to have  1. Lower extremity arterial doppler- During this test, ultrasound is used to evaluate arterial blood flow in the legs. Allow approximately one hour for this exam.   2. Dr. Gwenlyn Found has ordered for you to have lab work done and you MUST be FASTING.  Your physician wants you to follow-up in 1 year with Dr. Gwenlyn Found. You will receive a reminder letter in the mail 2 months in advance. If you do not receive a letter, please call our office to schedule the follow-up appointment.

## 2014-03-24 NOTE — Assessment & Plan Note (Signed)
History of hyperlipidemia on atorvastatin 40 mg a day. We will recheck a lipid and liver profile 

## 2014-03-24 NOTE — Assessment & Plan Note (Signed)
The patient does have moderate bilateral external iliac artery stenosis by Ultrasound last checked 5 years ago. He does complain of some left hip pain which may be arthritic. He has bifemoral bruits. I am going to repeat his lower extremity arterial Doppler studies.

## 2014-03-29 ENCOUNTER — Other Ambulatory Visit: Payer: Medicare Other

## 2014-03-29 LAB — HEPATIC FUNCTION PANEL
ALK PHOS: 65 U/L (ref 39–117)
ALT: 24 U/L (ref 0–53)
AST: 23 U/L (ref 0–37)
Albumin: 4.4 g/dL (ref 3.5–5.2)
BILIRUBIN DIRECT: 0.1 mg/dL (ref 0.0–0.3)
BILIRUBIN INDIRECT: 0.4 mg/dL (ref 0.2–1.2)
Total Bilirubin: 0.5 mg/dL (ref 0.2–1.2)
Total Protein: 7.5 g/dL (ref 6.0–8.3)

## 2014-03-29 LAB — LIPID PANEL
CHOLESTEROL: 132 mg/dL (ref 0–200)
HDL: 33 mg/dL — ABNORMAL LOW (ref >39)
LDL Cholesterol: 58 mg/dL (ref 0–99)
Total CHOL/HDL Ratio: 4 Ratio
Triglycerides: 207 mg/dL — ABNORMAL HIGH (ref <150)
VLDL: 41 mg/dL — ABNORMAL HIGH (ref 0–40)

## 2014-04-07 ENCOUNTER — Ambulatory Visit (HOSPITAL_COMMUNITY)
Admission: RE | Admit: 2014-04-07 | Discharge: 2014-04-07 | Disposition: A | Discharge status: Home / Self Care | Payer: Medicare Other | Source: Ambulatory Visit | Attending: Internal Medicine | Admitting: Internal Medicine

## 2014-04-07 DIAGNOSIS — M79604 Pain in right leg: Secondary | ICD-10-CM | POA: Insufficient documentation

## 2014-04-07 DIAGNOSIS — M79605 Pain in left leg: Secondary | ICD-10-CM | POA: Insufficient documentation

## 2014-04-07 DIAGNOSIS — I739 Peripheral vascular disease, unspecified: Secondary | ICD-10-CM | POA: Diagnosis present

## 2014-04-07 NOTE — Progress Notes (Signed)
Lower Extremity Arterial Duplex Completed. °Brianna L Mazza,RVT °

## 2014-05-03 DIAGNOSIS — K5900 Constipation, unspecified: Secondary | ICD-10-CM | POA: Diagnosis not present

## 2014-05-03 DIAGNOSIS — I2510 Atherosclerotic heart disease of native coronary artery without angina pectoris: Secondary | ICD-10-CM | POA: Diagnosis not present

## 2014-05-03 DIAGNOSIS — Z86010 Personal history of colonic polyps: Secondary | ICD-10-CM | POA: Diagnosis not present

## 2014-05-05 ENCOUNTER — Telehealth: Payer: Self-pay | Admitting: *Deleted

## 2014-05-05 NOTE — Telephone Encounter (Signed)
Please see faxed progress note from Dr. Benson Norway. Dr. Benson Norway is asking if patient can stop plavix five days prior to colonoscopy on 05/27/2014.

## 2014-05-05 NOTE — Telephone Encounter (Signed)
He can stop his Plavix before his colonoscopy, and started back afterwards

## 2014-05-07 NOTE — Telephone Encounter (Signed)
Letter was created and faxed to Dr Benson Norway

## 2014-05-10 ENCOUNTER — Other Ambulatory Visit: Payer: Self-pay | Admitting: Cardiovascular Disease

## 2014-05-10 MED ORDER — CLOPIDOGREL BISULFATE 75 MG PO TABS: 75.0000 mg | ORAL_TABLET | Freq: Every day | ORAL | Status: DC

## 2014-05-10 NOTE — Telephone Encounter (Signed)
Pt need a new prescription for his generic Plavix. Please call to [facility] [phone] or [phone]

## 2014-05-10 NOTE — Telephone Encounter (Signed)
Rx(s) sent to pharmacy electronically.  

## 2014-05-27 DIAGNOSIS — K635 Polyp of colon: Secondary | ICD-10-CM | POA: Diagnosis not present

## 2014-05-27 DIAGNOSIS — K5730 Diverticulosis of large intestine without perforation or abscess without bleeding: Secondary | ICD-10-CM | POA: Diagnosis not present

## 2014-05-27 DIAGNOSIS — D122 Benign neoplasm of ascending colon: Secondary | ICD-10-CM | POA: Diagnosis not present

## 2014-05-27 DIAGNOSIS — Z1211 Encounter for screening for malignant neoplasm of colon: Secondary | ICD-10-CM | POA: Diagnosis not present

## 2014-05-27 DIAGNOSIS — D124 Benign neoplasm of descending colon: Secondary | ICD-10-CM | POA: Diagnosis not present

## 2014-06-03 DIAGNOSIS — H35033 Hypertensive retinopathy, bilateral: Secondary | ICD-10-CM | POA: Diagnosis not present

## 2014-06-03 DIAGNOSIS — H02403 Unspecified ptosis of bilateral eyelids: Secondary | ICD-10-CM | POA: Diagnosis not present

## 2014-06-03 DIAGNOSIS — H40013 Open angle with borderline findings, low risk, bilateral: Secondary | ICD-10-CM | POA: Diagnosis not present

## 2014-06-03 DIAGNOSIS — H01003 Unspecified blepharitis right eye, unspecified eyelid: Secondary | ICD-10-CM | POA: Diagnosis not present

## 2014-06-03 DIAGNOSIS — Z961 Presence of intraocular lens: Secondary | ICD-10-CM | POA: Diagnosis not present

## 2014-06-14 ENCOUNTER — Telehealth: Payer: Self-pay | Admitting: Family Medicine

## 2014-06-14 MED ORDER — ATORVASTATIN CALCIUM 40 MG PO TABS: 40.0000 mg | ORAL_TABLET | Freq: Every day | ORAL | Status: DC

## 2014-06-14 NOTE — Telephone Encounter (Signed)
Med sent to pharm and pt's LOV was 2/15 therefore he is due for an OV will send letter

## 2014-06-14 NOTE — Telephone Encounter (Signed)
[facility]  In [place]  [phone] Or [phone]  Patient is calling to get refill on lipitor  Please call him about this at  [phone]

## 2014-06-21 ENCOUNTER — Other Ambulatory Visit: Payer: Medicare Other

## 2014-06-21 DIAGNOSIS — I739 Peripheral vascular disease, unspecified: Secondary | ICD-10-CM

## 2014-06-21 DIAGNOSIS — Z79899 Other long term (current) drug therapy: Secondary | ICD-10-CM

## 2014-06-21 DIAGNOSIS — I10 Essential (primary) hypertension: Secondary | ICD-10-CM | POA: Diagnosis not present

## 2014-06-21 DIAGNOSIS — E785 Hyperlipidemia, unspecified: Secondary | ICD-10-CM

## 2014-06-21 DIAGNOSIS — Z720 Tobacco use: Secondary | ICD-10-CM

## 2014-06-21 DIAGNOSIS — I779 Disorder of arteries and arterioles, unspecified: Secondary | ICD-10-CM | POA: Diagnosis not present

## 2014-06-21 LAB — CBC WITH DIFFERENTIAL/PLATELET
BASOS ABS: 0.1 10*3/uL (ref 0.0–0.1)
Basophils Relative: 1 % (ref 0–1)
EOS PCT: 5 % (ref 0–5)
Eosinophils Absolute: 0.4 10*3/uL (ref 0.0–0.7)
HEMATOCRIT: 48.6 % (ref 39.0–52.0)
HEMOGLOBIN: 16.6 g/dL (ref 13.0–17.0)
Lymphocytes Relative: 20 % (ref 12–46)
Lymphs Abs: 1.7 10*3/uL (ref 0.7–4.0)
MCH: 32.5 pg (ref 26.0–34.0)
MCHC: 34.2 g/dL (ref 30.0–36.0)
MCV: 95.1 fL (ref 78.0–100.0)
MONO ABS: 0.7 10*3/uL (ref 0.1–1.0)
MPV: 9.5 fL (ref 8.6–12.4)
Monocytes Relative: 8 % (ref 3–12)
Neutro Abs: 5.5 10*3/uL (ref 1.7–7.7)
Neutrophils Relative %: 66 % (ref 43–77)
Platelets: 217 10*3/uL (ref 150–400)
RBC: 5.11 MIL/uL (ref 4.22–5.81)
RDW: 13.4 % (ref 11.5–15.5)
WBC: 8.3 10*3/uL (ref 4.0–10.5)

## 2014-06-21 LAB — BASIC METABOLIC PANEL
BUN: 16 mg/dL (ref 6–23)
CHLORIDE: 104 meq/L (ref 96–112)
CO2: 27 meq/L (ref 19–32)
Calcium: 10.1 mg/dL (ref 8.4–10.5)
Creat: 1.07 mg/dL (ref 0.50–1.35)
GLUCOSE: 99 mg/dL (ref 70–99)
Potassium: 4.5 mEq/L (ref 3.5–5.3)
Sodium: 140 mEq/L (ref 135–145)

## 2014-06-21 LAB — TSH: TSH: 2.254 u[IU]/mL (ref 0.350–4.500)

## 2014-06-22 LAB — PSA, MEDICARE: PSA: 0.71 ng/mL (ref <=4.00)

## 2014-06-28 ENCOUNTER — Ambulatory Visit (INDEPENDENT_AMBULATORY_CARE_PROVIDER_SITE_OTHER): Payer: Medicare Other | Admitting: Family Medicine

## 2014-06-28 ENCOUNTER — Encounter: Payer: Self-pay | Admitting: Family Medicine

## 2014-06-28 VITALS — BP 110/60 | HR 98 | Temp 97.9°F | Resp 16 | Ht 74.0 in | Wt 201.0 lb

## 2014-06-28 DIAGNOSIS — Z0000 Encounter for general adult medical examination without abnormal findings: Secondary | ICD-10-CM

## 2014-06-28 DIAGNOSIS — Z23 Encounter for immunization: Secondary | ICD-10-CM | POA: Diagnosis not present

## 2014-06-28 MED ORDER — ATORVASTATIN CALCIUM 40 MG PO TABS: 40.0000 mg | ORAL_TABLET | Freq: Every day | ORAL | Status: DC

## 2014-06-28 NOTE — Addendum Note (Signed)
Addended by: Jenna Luo on: 06/28/2014 10:43 AM   Modules accepted: Orders

## 2014-06-28 NOTE — Progress Notes (Signed)
Subjective:    Patient ID: Brent Gonzales, male    DOB: 12-31-1945, 69 y.o.   MRN: [id number]  HPI Patient is a very pleasant 69 year old white male who is here today for complete physical exam. Unfortunately he continues to smoke and he has no desire to quit smoking. His colonoscopy was performed earlier this year and was significant for 3 tubular adenomas. He is scheduled to have another colonoscopy next year. Patient has had Pneumovax 23 along with Prevnar 13. He is due for his final dose of Pneumovax 23 today. Shingles vaccine and flu shot are up-to-date. He is due for a tetanus vaccine but he declines this today. He is also due for a prostate exam. His PSA was 0.7 recently. His cholesterol was checked in December at his cardiologist and his LDL cholesterol was well below 70. His HDL cholesterol was slightly low at 33. We discussed this today along with his recent lab work that he had here: Lab on 06/21/2014  Component Date Value Ref Range Status  . TSH 06/21/2014 2.254  0.350 - 4.500 uIU/mL Final  . Sodium 06/21/2014 140  135 - 145 mEq/L Final  . Potassium 06/21/2014 4.5  3.5 - 5.3 mEq/L Final  . Chloride 06/21/2014 104  96 - 112 mEq/L Final  . CO2 06/21/2014 27  19 - 32 mEq/L Final  . Glucose, Bld 06/21/2014 99  70 - 99 mg/dL Final  . BUN 06/21/2014 16  6 - 23 mg/dL Final  . Creat 06/21/2014 1.07  0.50 - 1.35 mg/dL Final  . Calcium 06/21/2014 10.1  8.4 - 10.5 mg/dL Final  . PSA 06/21/2014 0.71  <=4.00 ng/mL Final   Comment: Test Methodology: ECLIA PSA (Electrochemiluminescence Immunoassay)   For PSA values from 2.5-4.0, particularly in younger men <49 years old, the AUA and NCCN suggest testing for % Free PSA (3515) and evaluation of the rate of increase in PSA (PSA velocity).   . WBC 06/21/2014 8.3  4.0 - 10.5 K/uL Final  . RBC 06/21/2014 5.11  4.22 - 5.81 MIL/uL Final  . Hemoglobin 06/21/2014 16.6  13.0 - 17.0 g/dL Final  . HCT 06/21/2014 48.6  39.0 - 52.0 % Final  . MCV  06/21/2014 95.1  78.0 - 100.0 fL Final  . MCH 06/21/2014 32.5  26.0 - 34.0 pg Final  . MCHC 06/21/2014 34.2  30.0 - 36.0 g/dL Final  . RDW 06/21/2014 13.4  11.5 - 15.5 % Final  . Platelets 06/21/2014 217  150 - 400 K/uL Final  . MPV 06/21/2014 9.5  8.6 - 12.4 fL Final  . Neutrophils Relative % 06/21/2014 66  43 - 77 % Final  . Neutro Abs 06/21/2014 5.5  1.7 - 7.7 K/uL Final  . Lymphocytes Relative 06/21/2014 20  12 - 46 % Final  . Lymphs Abs 06/21/2014 1.7  0.7 - 4.0 K/uL Final  . Monocytes Relative 06/21/2014 8  3 - 12 % Final  . Monocytes Absolute 06/21/2014 0.7  0.1 - 1.0 K/uL Final  . Eosinophils Relative 06/21/2014 5  0 - 5 % Final  . Eosinophils Absolute 06/21/2014 0.4  0.0 - 0.7 K/uL Final  . Basophils Relative 06/21/2014 1  0 - 1 % Final  . Basophils Absolute 06/21/2014 0.1  0.0 - 0.1 K/uL Final  . Smear Review 06/21/2014 Criteria for review not met   Final   Past Medical History  Diagnosis Date  . Hyperlipidemia   . Tobacco abuse   . Carotid artery disease  right ICA stenosis  . Hypertension   . High cholesterol   . Aorto-iliac disease     mild bilateral external. ABIs in the 0.9 range   Past Surgical History  Procedure Laterality Date  . Tonsillectomy and adenoidectomy    . Hernia repair      left and right  . Nm myoview ltd  03/2010    Inferior septal ischemia  . Duplex ultrasound    . Doppler echocardiography    . Stress myocardial perfusion     Current Outpatient Prescriptions on File Prior to Visit  Medication Sig Dispense Refill  . aspirin 81 MG tablet Take 81 mg by mouth daily.    Marland Kitchen atorvastatin (LIPITOR) 40 MG tablet Take 1 tablet (40 mg total) by mouth daily. 90 tablet 0  . Cholecalciferol (VITAMIN D) 2000 UNITS CAPS Take 2,000 Units by mouth daily.    . clopidogrel (PLAVIX) 75 MG tablet Take 1 tablet (75 mg total) by mouth daily with breakfast. 365 tablet 0  . Coenzyme Q10 (CO Q10) 100 MG CAPS Take 200 mg by mouth daily.    Marland Kitchen  glucosamine-chondroitin 500-400 MG tablet Take 1 tablet by mouth 2 (two) times daily.    Marland Kitchen KRILL OIL PO Take 2 capsules by mouth daily.    . Multiple Vitamin (MULTIVITAMIN) capsule Take 1 capsule by mouth daily.     No current facility-administered medications on file prior to visit.   Allergies  Allergen Reactions  . Penicillins    History   Social History  . Marital Status: Married    Spouse Name: N/A  . Number of Children: N/A  . Years of Education: N/A   Occupational History  . Not on file.   Social History Main Topics  . Smoking status: Current Every Day Smoker    Types: Cigarettes  . Smokeless tobacco: Never Used     Comment: weaning off cigs and using the e cig  . Alcohol Use: 0.6 oz/week    1 Cans of beer per week  . Drug Use: No  . Sexual Activity: Yes   Other Topics Concern  . Not on file   Social History Narrative   No family history on file.    Review of Systems  All other systems reviewed and are negative.      Objective:   Physical Exam  Constitutional: He is oriented to person, place, and time. He appears well-developed and well-nourished. No distress.  HENT:  Head: Normocephalic and atraumatic.  Right Ear: External ear normal.  Left Ear: External ear normal.  Nose: Nose normal.  Mouth/Throat: Oropharynx is clear and moist. No oropharyngeal exudate.  Eyes: Conjunctivae and EOM are normal. Pupils are equal, round, and reactive to light. Right eye exhibits no discharge. Left eye exhibits no discharge. No scleral icterus.  Neck: Normal range of motion. Neck supple. No JVD present. No tracheal deviation present. No thyromegaly present.  Cardiovascular: Normal rate, regular rhythm, normal heart sounds and intact distal pulses.  Exam reveals no gallop and no friction rub.   No murmur heard. Pulmonary/Chest: Effort normal and breath sounds normal. No stridor. No respiratory distress. He has no wheezes. He has no rales. He exhibits no tenderness.    Abdominal: Soft. Bowel sounds are normal. He exhibits no distension and no mass. There is no tenderness. There is no rebound and no guarding.  Genitourinary: Rectum normal, prostate normal and penis normal.  Musculoskeletal: Normal range of motion. He exhibits no edema or tenderness.  Lymphadenopathy:    He has no cervical adenopathy.  Neurological: He is alert and oriented to person, place, and time. He has normal reflexes. He displays normal reflexes. No cranial nerve deficit. He exhibits normal muscle tone. Coordination normal.  Skin: Skin is warm. No rash noted. He is not diaphoretic. No erythema. No pallor.  Psychiatric: He has a normal mood and affect. His behavior is normal. Judgment and thought content normal.  Vitals reviewed.         Assessment & Plan:  Routine general medical examination at a health care facility  Physical exam today is completely normal. We reviewed his lab work from December as well as from earlier this month. Labs are significant only for an elevated hemoglobin related to his smoking and a low HDL cholesterol. I encouraged smoking cessation and also increasing aerobic exercise. Blood pressure is excellent. Cancer screening is up-to-date. The remainder of his lab work is excellent. Patient received his last dose of Pneumovax 23 today. Also encouraged a tetanus shot but the patient declines that at the present time. The remainder of his preventative care is up-to-date.

## 2014-06-28 NOTE — Addendum Note (Signed)
Addended by: Shary Decamp B on: 06/28/2014 11:01 AM   Modules accepted: Orders

## 2015-03-23 ENCOUNTER — Other Ambulatory Visit: Payer: Self-pay | Admitting: Cardiovascular Disease

## 2015-03-23 DIAGNOSIS — I739 Peripheral vascular disease, unspecified: Secondary | ICD-10-CM

## 2015-03-23 DIAGNOSIS — I6523 Occlusion and stenosis of bilateral carotid arteries: Secondary | ICD-10-CM

## 2015-03-25 ENCOUNTER — Ambulatory Visit (INDEPENDENT_AMBULATORY_CARE_PROVIDER_SITE_OTHER): Payer: Medicare Other | Admitting: Cardiovascular Disease

## 2015-03-25 ENCOUNTER — Encounter: Payer: Self-pay | Admitting: Cardiovascular Disease

## 2015-03-25 VITALS — BP 140/76 | HR 81 | Ht 74.0 in | Wt 195.0 lb

## 2015-03-25 DIAGNOSIS — I10 Essential (primary) hypertension: Secondary | ICD-10-CM

## 2015-03-25 DIAGNOSIS — I739 Peripheral vascular disease, unspecified: Secondary | ICD-10-CM

## 2015-03-25 DIAGNOSIS — Z720 Tobacco use: Secondary | ICD-10-CM

## 2015-03-25 DIAGNOSIS — E785 Hyperlipidemia, unspecified: Secondary | ICD-10-CM

## 2015-03-25 DIAGNOSIS — I6523 Occlusion and stenosis of bilateral carotid arteries: Secondary | ICD-10-CM

## 2015-03-25 DIAGNOSIS — I779 Disorder of arteries and arterioles, unspecified: Secondary | ICD-10-CM

## 2015-03-25 NOTE — Assessment & Plan Note (Signed)
Continued tobacco abuse recalcitrant risk factor modification. 

## 2015-03-25 NOTE — Patient Instructions (Signed)
Medication Instructions:  Your physician recommends that you continue on your current medications as directed. Please refer to the Current Medication list given to you today.   Labwork: Your physician recommends that you return for lab work in: FASTING (lipid/liver) The lab can be found on the FIRST FLOOR of out building in [address]   Testing/Procedures: Your physician has requested that you have a lower extremity arterial duplex. During this test, exercise and ultrasound are used to evaluate arterial blood flow in the legs. Allow one hour for this exam. There are no restrictions or special instructions. - Pt HAS SCHEDULED 12/16 ABI'S AND CAROTIDS CAN WE ADD THIS TO THAT DAY IF POSSIBLE   Follow-Up: Your physician wants you to follow-up in: 12 months with Dr. Andria Rhein will receive a reminder letter in the mail two months in advance. If you don't receive a letter, please call our office to schedule the follow-up appointment.   Any Other Special Instructions Will Be Listed Below (If Applicable).     If you need a refill on your cardiac medications before your next appointment, please call your pharmacy.

## 2015-03-25 NOTE — Assessment & Plan Note (Signed)
History of carotid artery disease with moderate right and mild left ICA stenosis by duplex ultrasound 03/10/14. We will continue to monitor to these noninvasively.

## 2015-03-25 NOTE — Addendum Note (Signed)
Addended by: Newt Minion on: 03/25/2015 02:31 PM   Modules accepted: Orders

## 2015-03-25 NOTE — Assessment & Plan Note (Signed)
History of peripheral arterial disease with Dopplers performed 04/07/14 revealing moderate bilateral external iliac artery stenosis with normal ABIs. The patient denies claudication. We'll we will continue to monitor these noninvasively

## 2015-03-25 NOTE — Assessment & Plan Note (Signed)
History of hypertension blood pressure measured at 140/76.  He is not on antihypertensive medications

## 2015-03-25 NOTE — Addendum Note (Signed)
Addended by: Newt Minion on: 03/25/2015 04:13 PM   Modules accepted: Orders

## 2015-03-25 NOTE — Assessment & Plan Note (Signed)
History of hyperlipidemia on atorvastatin. We will recheck a lipid and liver profile 

## 2015-03-25 NOTE — Progress Notes (Signed)
03/25/2015 Brent Gonzales   [birth year]  [id number]  Primary Physician Odette Fraction, MD Primary Cardiologist: Lorretta Harp MD Renae Gloss   HPI:  The patient is a 69 year old, thin-appearing, married Caucasian male father to 3 stepchildren who I last saw a year ago. He has a history of continued tobacco abuse 1/2 pack per day although he is trying to cut this down and has transitioned to an E. cigarette, treated hypertension and hyperlipidemia. He has moderate carotid disease with a right ICA stenosis that has remained stable by ultrasound as recently as 03/10/14.Marland Kitchen He also has mild bilateral external iliac disease by duplex ultrasound with normal ABIs, though, he denies claudication but does complain of some left hip pain.. Myoview performed December 2011 showed inferior septal ischemia that we decided to treat conservatively since he is asymptomatic and Dr. Dennard Schaumann follows his lipid profile. He has had not chest or shortness of breath since I last saw him.   Current Outpatient Prescriptions  Medication Sig Dispense Refill  . aspirin 81 MG tablet Take 81 mg by mouth daily.    Marland Kitchen atorvastatin (LIPITOR) 40 MG tablet Take 1 tablet (40 mg total) by mouth daily. 365 tablet 0  . Cholecalciferol (VITAMIN D) 2000 UNITS CAPS Take 2,000 Units by mouth daily.    . clopidogrel (PLAVIX) 75 MG tablet Take 1 tablet (75 mg total) by mouth daily with breakfast. 365 tablet 0  . Coenzyme Q10 (CO Q10) 100 MG CAPS Take 200 mg by mouth daily.    Marland Kitchen glucosamine-chondroitin 500-400 MG tablet Take 1 tablet by mouth 2 (two) times daily.    Marland Kitchen KRILL OIL PO Take 2 capsules by mouth daily.    . Multiple Vitamin (MULTIVITAMIN) capsule Take 1 capsule by mouth daily.     No current facility-administered medications for this visit.    Allergies  Allergen Reactions  . Penicillins     Social History   Social History  . Marital Status: Married    Spouse Name: N/A  . Number of Children: N/A  .  Years of Education: N/A   Occupational History  . Not on file.   Social History Main Topics  . Smoking status: Current Every Day Smoker    Types: Cigarettes  . Smokeless tobacco: Never Used     Comment: weaning off cigs and using the e cig  . Alcohol Use: 0.6 oz/week    1 Cans of beer per week  . Drug Use: No  . Sexual Activity: Yes   Other Topics Concern  . Not on file   Social History Narrative     Review of Systems: General: negative for chills, fever, night sweats or weight changes.  Cardiovascular: negative for chest pain, dyspnea on exertion, edema, orthopnea, palpitations, paroxysmal nocturnal dyspnea or shortness of breath Dermatological: negative for rash Respiratory: negative for cough or wheezing Urologic: negative for hematuria Abdominal: negative for nausea, vomiting, diarrhea, bright red blood per rectum, melena, or hematemesis Neurologic: negative for visual changes, syncope, or dizziness All other systems reviewed and are otherwise negative except as noted above.    Blood pressure 140/76, pulse 81, height 6\' 2"  (1.88 m), weight 195 lb (88.451 kg).  General appearance: alert and no distress Neck: no adenopathy, no JVD, supple, symmetrical, trachea midline, thyroid not enlarged, symmetric, no tenderness/mass/nodules and bilateral carotid bruits left louder than right Lungs: clear to auscultation bilaterally Heart: regular rate and rhythm, S1, S2 normal, no murmur, click, rub or gallop Extremities: extremities  normal, atraumatic, no cyanosis or edema  EKG normal sinus rhythm 81 without ST or T-wave changes. I personally reviewed this EKG  ASSESSMENT AND PLAN:   Tobacco abuse Continued tobacco abuse recalcitrant risk factor modification  Peripheral arterial disease History of peripheral arterial disease with Dopplers performed 04/07/14 revealing moderate bilateral external iliac artery stenosis with normal ABIs. The patient denies claudication. We'll we  will continue to monitor these noninvasively  Hyperlipidemia History of hyperlipidemia on atorvastatin. We will recheck a lipid and liver profile  Essential hypertension History of hypertension blood pressure measured at 140/76.  He is not on antihypertensive medications  Carotid artery disease History of carotid artery disease with moderate right and mild left ICA stenosis by duplex ultrasound 03/10/14. We will continue to monitor to these noninvasively.      Lorretta Harp MD FACP,FACC,FAHA, [facility] 03/25/2015 8:44 AM

## 2015-03-31 DIAGNOSIS — I10 Essential (primary) hypertension: Secondary | ICD-10-CM | POA: Diagnosis not present

## 2015-04-01 LAB — HEPATIC FUNCTION PANEL
ALT: 20 U/L (ref 9–46)
AST: 24 U/L (ref 10–35)
Albumin: 3.9 g/dL (ref 3.6–5.1)
Alkaline Phosphatase: 61 U/L (ref 40–115)
BILIRUBIN DIRECT: 0.1 mg/dL (ref <=0.2)
BILIRUBIN INDIRECT: 0.3 mg/dL (ref 0.2–1.2)
BILIRUBIN TOTAL: 0.4 mg/dL (ref 0.2–1.2)
TOTAL PROTEIN: 7.1 g/dL (ref 6.1–8.1)

## 2015-04-01 LAB — LIPID PANEL
CHOL/HDL RATIO: 3.9 ratio (ref <=5.0)
Cholesterol: 124 mg/dL — ABNORMAL LOW (ref 125–200)
HDL: 32 mg/dL — ABNORMAL LOW (ref >=40)
LDL Cholesterol: 67 mg/dL (ref <130)
TRIGLYCERIDES: 123 mg/dL (ref <150)
VLDL: 25 mg/dL (ref <30)

## 2015-04-04 ENCOUNTER — Inpatient Hospital Stay (HOSPITAL_COMMUNITY): Admission: RE | Admit: 2015-04-04 | Payer: Medicare Other | Source: Ambulatory Visit

## 2015-04-05 ENCOUNTER — Ambulatory Visit (INDEPENDENT_AMBULATORY_CARE_PROVIDER_SITE_OTHER): Payer: Medicare Other | Admitting: Family Medicine

## 2015-04-05 ENCOUNTER — Encounter: Payer: Self-pay | Admitting: Family Medicine

## 2015-04-05 ENCOUNTER — Ambulatory Visit
Admission: RE | Admit: 2015-04-05 | Discharge: 2015-04-05 | Disposition: A | Discharge status: Home / Self Care | Payer: Medicare Other | Source: Ambulatory Visit | Attending: Family Medicine | Admitting: Family Medicine

## 2015-04-05 VITALS — BP 108/60 | HR 80 | Temp 98.0°F | Resp 16 | Ht 74.0 in | Wt 195.0 lb

## 2015-04-05 DIAGNOSIS — M5134 Other intervertebral disc degeneration, thoracic region: Secondary | ICD-10-CM | POA: Diagnosis not present

## 2015-04-05 DIAGNOSIS — M549 Dorsalgia, unspecified: Secondary | ICD-10-CM | POA: Diagnosis not present

## 2015-04-05 DIAGNOSIS — R05 Cough: Secondary | ICD-10-CM | POA: Diagnosis not present

## 2015-04-05 DIAGNOSIS — R059 Cough, unspecified: Secondary | ICD-10-CM

## 2015-04-05 DIAGNOSIS — R918 Other nonspecific abnormal finding of lung field: Secondary | ICD-10-CM | POA: Diagnosis not present

## 2015-04-05 NOTE — Progress Notes (Signed)
Subjective:    Patient ID: Brent Gonzales, male    DOB: February 08, 1946, 69 y.o.   MRN: [id number]  HPI The patient's pain began 2-3 weeks ago. He was playing golf and riding a golf cart when he hit a bump. He instantly felt pain around the level of T8 which radiated around his abdomen and his mid section bilaterally following the path of the ribs.  Since that time, the patient has had pain just to the right of the thoracic spine around the level of the eighth thoracic vertebrae that radiates along the body of the rib to just below his right nipple. There are no exacerbating or alleviating factors. The pain is unrelated to food. He denies any black tarry stools or blood in his stool. He denies any nausea or vomiting. He does report a cough. He denies any hemoptysis. He does have a long-standing history of smoking. He has had vascular ultrasound performed of his cardiologist which has shown no abdominal aortic aneurysm in the distal aorta although he has had no imaging of the thoracic aorta. On examination today he has a palpable pulse in his abdomen but there is no pulsatile mass. His abdomen is soft nondistended and nontender. I'm unable to reproduce the pain on exam. He is tender to palpation along the thoracic paraspinal muscles ear there is no tenderness to palpation along the rib on that side Past Medical History  Diagnosis Date  . Hyperlipidemia   . Tobacco abuse   . Carotid artery disease ([place])     right ICA stenosis  . Hypertension   . High cholesterol   . Aorto-iliac disease ([place])     mild bilateral external. ABIs in the 0.9 range   Past Surgical History  Procedure Laterality Date  . Tonsillectomy and adenoidectomy    . Hernia repair      left and right  . Nm myoview ltd  03/2010    Inferior septal ischemia  . Duplex ultrasound    . Doppler echocardiography    . Stress myocardial perfusion     Current Outpatient Prescriptions on File Prior to Visit  Medication Sig Dispense Refill    . aspirin 81 MG tablet Take 81 mg by mouth daily.    Marland Kitchen atorvastatin (LIPITOR) 40 MG tablet Take 1 tablet (40 mg total) by mouth daily. 365 tablet 0  . Cholecalciferol (VITAMIN D) 2000 UNITS CAPS Take 2,000 Units by mouth daily.    . clopidogrel (PLAVIX) 75 MG tablet Take 1 tablet (75 mg total) by mouth daily with breakfast. 365 tablet 0  . Coenzyme Q10 (CO Q10) 100 MG CAPS Take 200 mg by mouth daily.    Marland Kitchen glucosamine-chondroitin 500-400 MG tablet Take 1 tablet by mouth 2 (two) times daily.    Marland Kitchen KRILL OIL PO Take 2 capsules by mouth daily.    . Multiple Vitamin (MULTIVITAMIN) capsule Take 1 capsule by mouth daily.     No current facility-administered medications on file prior to visit.   Allergies  Allergen Reactions  . Penicillins    Social History   Social History  . Marital Status: Married    Spouse Name: N/A  . Number of Children: N/A  . Years of Education: N/A   Occupational History  . Not on file.   Social History Main Topics  . Smoking status: Current Every Day Smoker    Types: Cigarettes  . Smokeless tobacco: Never Used     Comment: weaning off cigs and using the  e cig  . Alcohol Use: 0.6 oz/week    1 Cans of beer per week  . Drug Use: No  . Sexual Activity: Yes   Other Topics Concern  . Not on file   Social History Narrative      Review of Systems     Objective:   Physical Exam  Cardiovascular: Normal rate, regular rhythm and normal heart sounds.   Pulmonary/Chest: Effort normal and breath sounds normal. No respiratory distress. He has no wheezes. He has no rales.  Abdominal: Soft. Bowel sounds are normal. He exhibits no distension and no mass. There is no tenderness. There is no rebound and no guarding.  Musculoskeletal: He exhibits no edema.       Thoracic back: He exhibits tenderness, bony tenderness and pain. He exhibits normal range of motion, no spasm and normal pulse.  Vitals reviewed.         Assessment & Plan:  Cough - Plan: DG Chest 2  View  Mid-back pain, acute - Plan: DG Thoracic Spine W/Swimmers  I suspect thoracic degenerative disc disease or possibly a thoracic compression vertebral fracture. Obtain a thoracic spine x-ray. Given his long-standing smoking history, I would also like to obtain a chest x-ray. Symptoms do not sound consistent with biliary disease. If chest x-ray and thoracic spine x-ray are completely normal, I would consider obtaining an ultrasound of the abdomen to rule out AAA as well as evaluate for biliary tract disease

## 2015-04-06 ENCOUNTER — Other Ambulatory Visit: Payer: Self-pay | Admitting: Family Medicine

## 2015-04-06 DIAGNOSIS — R1084 Generalized abdominal pain: Secondary | ICD-10-CM

## 2015-04-06 DIAGNOSIS — R05 Cough: Secondary | ICD-10-CM

## 2015-04-06 DIAGNOSIS — J189 Pneumonia, unspecified organism: Secondary | ICD-10-CM

## 2015-04-06 DIAGNOSIS — M549 Dorsalgia, unspecified: Secondary | ICD-10-CM

## 2015-04-06 DIAGNOSIS — H40013 Open angle with borderline findings, low risk, bilateral: Secondary | ICD-10-CM | POA: Diagnosis not present

## 2015-04-06 DIAGNOSIS — J181 Lobar pneumonia, unspecified organism: Secondary | ICD-10-CM

## 2015-04-06 DIAGNOSIS — R059 Cough, unspecified: Secondary | ICD-10-CM

## 2015-04-06 MED ORDER — LEVOFLOXACIN 500 MG PO TABS: 500.0000 mg | ORAL_TABLET | Freq: Every day | ORAL | Status: DC

## 2015-04-08 ENCOUNTER — Ambulatory Visit (HOSPITAL_COMMUNITY)
Admission: RE | Admit: 2015-04-08 | Discharge: 2015-04-08 | Disposition: A | Discharge status: Home / Self Care | Payer: Medicare Other | Source: Ambulatory Visit | Attending: Cardiovascular Disease | Admitting: Cardiovascular Disease

## 2015-04-08 ENCOUNTER — Ambulatory Visit (HOSPITAL_COMMUNITY)
Admission: RE | Admit: 2015-04-08 | Discharge: 2015-04-08 | Disposition: A | Discharge status: Home / Self Care | Payer: Medicare Other | Source: Ambulatory Visit | Attending: Cardiology | Admitting: Cardiology

## 2015-04-08 DIAGNOSIS — I739 Peripheral vascular disease, unspecified: Secondary | ICD-10-CM | POA: Insufficient documentation

## 2015-04-08 DIAGNOSIS — I6523 Occlusion and stenosis of bilateral carotid arteries: Secondary | ICD-10-CM | POA: Insufficient documentation

## 2015-04-08 DIAGNOSIS — R1084 Generalized abdominal pain: Secondary | ICD-10-CM | POA: Diagnosis not present

## 2015-04-08 DIAGNOSIS — I77811 Abdominal aortic ectasia: Secondary | ICD-10-CM | POA: Insufficient documentation

## 2015-04-08 DIAGNOSIS — F17200 Nicotine dependence, unspecified, uncomplicated: Secondary | ICD-10-CM | POA: Diagnosis not present

## 2015-04-08 DIAGNOSIS — M549 Dorsalgia, unspecified: Secondary | ICD-10-CM | POA: Diagnosis not present

## 2015-04-08 DIAGNOSIS — E785 Hyperlipidemia, unspecified: Secondary | ICD-10-CM | POA: Insufficient documentation

## 2015-04-08 DIAGNOSIS — I708 Atherosclerosis of other arteries: Secondary | ICD-10-CM | POA: Insufficient documentation

## 2015-04-08 DIAGNOSIS — I10 Essential (primary) hypertension: Secondary | ICD-10-CM | POA: Insufficient documentation

## 2015-04-08 DIAGNOSIS — I779 Disorder of arteries and arterioles, unspecified: Secondary | ICD-10-CM | POA: Diagnosis not present

## 2015-04-12 ENCOUNTER — Telehealth: Payer: Self-pay | Admitting: Family Medicine

## 2015-04-12 NOTE — Telephone Encounter (Signed)
Pt is calling to speak with a nurse about the antibiotics that Dr. Dennard Schaumann prescribed him. He does not want to begin taking Levaquin due to concern of the side effects. Please advise.  [phone]

## 2015-04-12 NOTE — Telephone Encounter (Signed)
Called twice and LVM twice.  Not sure what his concerns arebut if he doesn't want to try levaquin, we could use ceftin 500 bid for 1 week and a zpack.

## 2015-04-14 NOTE — Telephone Encounter (Signed)
Called and spoke to pt about potential side effects to all medications and if he wanted to use something else we could definitely call in the other antibx MD recommended. He states that he already has this antibx and will take this and have xray done after completion.

## 2015-04-20 ENCOUNTER — Ambulatory Visit
Admission: RE | Admit: 2015-04-20 | Discharge: 2015-04-20 | Disposition: A | Discharge status: Home / Self Care | Payer: Medicare Other | Source: Ambulatory Visit | Attending: Family Medicine | Admitting: Family Medicine

## 2015-04-20 DIAGNOSIS — M549 Dorsalgia, unspecified: Secondary | ICD-10-CM

## 2015-04-20 DIAGNOSIS — M5124 Other intervertebral disc displacement, thoracic region: Secondary | ICD-10-CM | POA: Diagnosis not present

## 2015-05-12 ENCOUNTER — Other Ambulatory Visit: Payer: Self-pay | Admitting: Cardiovascular Disease

## 2015-05-12 MED ORDER — CLOPIDOGREL BISULFATE 75 MG PO TABS: 75.0000 mg | ORAL_TABLET | Freq: Every day | ORAL | Status: DC

## 2015-05-12 NOTE — Telephone Encounter (Signed)
°*  STAT* If patient is at the pharmacy, call can be transferred to refill team.   1. Which medications need to be refilled? (please list name of each medication and dose if known) Clopidogrel   2. Which pharmacy/location (including street and city if local pharmacy) is medication to be sent to? [facility]   3. Do they need a 30 day or 90 day supply? [place]

## 2015-05-27 DIAGNOSIS — H40013 Open angle with borderline findings, low risk, bilateral: Secondary | ICD-10-CM | POA: Diagnosis not present

## 2015-05-27 DIAGNOSIS — H26492 Other secondary cataract, left eye: Secondary | ICD-10-CM | POA: Diagnosis not present

## 2015-05-27 DIAGNOSIS — Z961 Presence of intraocular lens: Secondary | ICD-10-CM | POA: Diagnosis not present

## 2015-05-27 DIAGNOSIS — H35031 Hypertensive retinopathy, right eye: Secondary | ICD-10-CM | POA: Diagnosis not present

## 2015-06-27 ENCOUNTER — Encounter: Payer: Self-pay | Admitting: Family Medicine

## 2015-06-27 ENCOUNTER — Ambulatory Visit (INDEPENDENT_AMBULATORY_CARE_PROVIDER_SITE_OTHER): Payer: Medicare Other | Admitting: Family Medicine

## 2015-06-27 VITALS — BP 122/70 | HR 84 | Temp 97.9°F | Resp 14 | Ht 74.0 in | Wt 195.0 lb

## 2015-06-27 DIAGNOSIS — L03115 Cellulitis of right lower limb: Secondary | ICD-10-CM

## 2015-06-27 MED ORDER — INDOMETHACIN 25 MG PO CAPS: 25.0000 mg | ORAL_CAPSULE | Freq: Three times a day (TID) | ORAL | Status: DC | PRN

## 2015-06-27 MED ORDER — SULFAMETHOXAZOLE-TRIMETHOPRIM 800-160 MG PO TABS: 1.0000 | ORAL_TABLET | Freq: Two times a day (BID) | ORAL | Status: DC

## 2015-06-27 NOTE — Progress Notes (Signed)
Subjective:    Patient ID: Brent Gonzales, male    DOB: [birth year], 70 y.o.   MRN: [id number]  HPI  Right foot is red hot swollen and painful. The skin is erythematous and tender to the touch on the dorsum of the right foot up to the ankle. It has been gradually progressing over the last week. However past medical history is also complicated by gout. He denies any trauma or puncture wounds to the foot. Past Medical History  Diagnosis Date  . Hyperlipidemia   . Tobacco abuse   . Carotid artery disease ([place])     right ICA stenosis  . Hypertension   . High cholesterol   . Aorto-iliac disease ([place])     mild bilateral external. ABIs in the 0.9 range   Past Surgical History  Procedure Laterality Date  . Tonsillectomy and adenoidectomy    . Hernia repair      left and right  . Nm myoview ltd  03/2010    Inferior septal ischemia  . Duplex ultrasound    . Doppler echocardiography    . Stress myocardial perfusion     Current Outpatient Prescriptions on File Prior to Visit  Medication Sig Dispense Refill  . aspirin 81 MG tablet Take 81 mg by mouth daily.    Marland Kitchen atorvastatin (LIPITOR) 40 MG tablet Take 1 tablet (40 mg total) by mouth daily. 365 tablet 0  . Cholecalciferol (VITAMIN D) 2000 UNITS CAPS Take 2,000 Units by mouth daily.    . clopidogrel (PLAVIX) 75 MG tablet Take 1 tablet (75 mg total) by mouth daily with breakfast. 365 tablet 0  . Coenzyme Q10 (CO Q10) 100 MG CAPS Take 200 mg by mouth daily.    Marland Kitchen glucosamine-chondroitin 500-400 MG tablet Take 1 tablet by mouth 2 (two) times daily.    Marland Kitchen KRILL OIL PO Take 2 capsules by mouth daily.    Marland Kitchen levofloxacin (LEVAQUIN) 500 MG tablet Take 1 tablet (500 mg total) by mouth daily. 7 tablet 0  . Multiple Vitamin (MULTIVITAMIN) capsule Take 1 capsule by mouth daily.     No current facility-administered medications on file prior to visit.   Allergies  Allergen Reactions  . Penicillins    Social History   Social History  . Marital  Status: Married    Spouse Name: N/A  . Number of Children: N/A  . Years of Education: N/A   Occupational History  . Not on file.   Social History Main Topics  . Smoking status: Current Every Day Smoker    Types: Cigarettes  . Smokeless tobacco: Never Used     Comment: weaning off cigs and using the e cig  . Alcohol Use: 0.6 oz/week    1 Cans of beer per week  . Drug Use: No  . Sexual Activity: Yes   Other Topics Concern  . Not on file   Social History Narrative     Review of Systems  All other systems reviewed and are negative.      Objective:   Physical Exam  Cardiovascular: Normal rate and regular rhythm.   Pulmonary/Chest: Effort normal and breath sounds normal.  Musculoskeletal:       Right foot: There is swelling. There is normal range of motion, no tenderness and no bony tenderness.  Skin: Skin is warm. There is erythema.  Vitals reviewed.         Assessment & Plan:  Cellulitis of right foot - Plan: sulfamethoxazole-trimethoprim (BACTRIM DS,SEPTRA DS) 800-160  MG tablet, indomethacin (INDOCIN) 25 MG capsule  I believe this is cellulitis, gout is less likely. Begin Bactrim double strength 1 by mouth twice a day for 10 days. He can use indomethacin 25 mg every 8 hours as needed for pain and swelling. Recheck in 48 hours or sooner if worse

## 2015-06-30 ENCOUNTER — Telehealth: Payer: Self-pay | Admitting: Family Medicine

## 2015-06-30 NOTE — Telephone Encounter (Signed)
Per patient he was supposed to call and let you know how he was doing after last visit  He is doing much better!!!

## 2015-07-06 DIAGNOSIS — Z86010 Personal history of colonic polyps: Secondary | ICD-10-CM | POA: Diagnosis not present

## 2015-07-06 DIAGNOSIS — K5900 Constipation, unspecified: Secondary | ICD-10-CM | POA: Diagnosis not present

## 2015-07-06 DIAGNOSIS — I2510 Atherosclerotic heart disease of native coronary artery without angina pectoris: Secondary | ICD-10-CM | POA: Diagnosis not present

## 2015-07-13 ENCOUNTER — Encounter: Payer: Self-pay | Admitting: Physician Assistant

## 2015-07-13 ENCOUNTER — Ambulatory Visit (INDEPENDENT_AMBULATORY_CARE_PROVIDER_SITE_OTHER): Payer: Medicare Other | Admitting: Physician Assistant

## 2015-07-13 ENCOUNTER — Ambulatory Visit
Admission: RE | Admit: 2015-07-13 | Discharge: 2015-07-13 | Disposition: A | Discharge status: Home / Self Care | Payer: Medicare Other | Source: Ambulatory Visit | Attending: Physician Assistant | Admitting: Physician Assistant

## 2015-07-13 ENCOUNTER — Telehealth: Payer: Self-pay

## 2015-07-13 VITALS — BP 150/90 | HR 80 | Temp 97.7°F | Resp 16

## 2015-07-13 DIAGNOSIS — I10 Essential (primary) hypertension: Secondary | ICD-10-CM | POA: Diagnosis not present

## 2015-07-13 DIAGNOSIS — E785 Hyperlipidemia, unspecified: Secondary | ICD-10-CM | POA: Diagnosis not present

## 2015-07-13 DIAGNOSIS — R0602 Shortness of breath: Secondary | ICD-10-CM | POA: Diagnosis not present

## 2015-07-13 DIAGNOSIS — Z720 Tobacco use: Secondary | ICD-10-CM

## 2015-07-13 DIAGNOSIS — F17200 Nicotine dependence, unspecified, uncomplicated: Secondary | ICD-10-CM

## 2015-07-13 DIAGNOSIS — I739 Peripheral vascular disease, unspecified: Secondary | ICD-10-CM

## 2015-07-13 DIAGNOSIS — J439 Emphysema, unspecified: Secondary | ICD-10-CM | POA: Diagnosis not present

## 2015-07-13 DIAGNOSIS — I779 Disorder of arteries and arterioles, unspecified: Secondary | ICD-10-CM

## 2015-07-13 DIAGNOSIS — R05 Cough: Secondary | ICD-10-CM | POA: Diagnosis not present

## 2015-07-13 LAB — CBC WITH DIFFERENTIAL/PLATELET
BASOS PCT: 0 % (ref 0–1)
Basophils Absolute: 0 10*3/uL (ref 0.0–0.1)
Eosinophils Absolute: 0.5 10*3/uL (ref 0.0–0.7)
Eosinophils Relative: 7 % — ABNORMAL HIGH (ref 0–5)
HCT: 47 % (ref 39.0–52.0)
HEMOGLOBIN: 16.3 g/dL (ref 13.0–17.0)
LYMPHS PCT: 21 % (ref 12–46)
Lymphs Abs: 1.6 10*3/uL (ref 0.7–4.0)
MCH: 32.8 pg (ref 26.0–34.0)
MCHC: 34.7 g/dL (ref 30.0–36.0)
MCV: 94.6 fL (ref 78.0–100.0)
MONOS PCT: 7 % (ref 3–12)
MPV: 9.7 fL (ref 8.6–12.4)
Monocytes Absolute: 0.5 10*3/uL (ref 0.1–1.0)
NEUTROS ABS: 5.1 10*3/uL (ref 1.7–7.7)
NEUTROS PCT: 65 % (ref 43–77)
Platelets: 211 10*3/uL (ref 150–400)
RBC: 4.97 MIL/uL (ref 4.22–5.81)
RDW: 12.8 % (ref 11.5–15.5)
WBC: 7.8 10*3/uL (ref 4.0–10.5)

## 2015-07-13 LAB — COMPLETE METABOLIC PANEL WITH GFR
ALBUMIN: 4.4 g/dL (ref 3.6–5.1)
ALK PHOS: 61 U/L (ref 40–115)
ALT: 20 U/L (ref 9–46)
AST: 25 U/L (ref 10–35)
BUN: 12 mg/dL (ref 7–25)
CHLORIDE: 106 mmol/L (ref 98–110)
CO2: 24 mmol/L (ref 20–31)
Calcium: 9.6 mg/dL (ref 8.6–10.3)
Creat: 0.91 mg/dL (ref 0.70–1.18)
GFR, EST NON AFRICAN AMERICAN: 85 mL/min (ref >=60)
GFR, Est African American: >89 mL/min (ref >=60)
GLUCOSE: 78 mg/dL (ref 70–99)
POTASSIUM: 4.4 mmol/L (ref 3.5–5.3)
SODIUM: 140 mmol/L (ref 135–146)
Total Bilirubin: 0.4 mg/dL (ref 0.2–1.2)
Total Protein: 7.3 g/dL (ref 6.1–8.1)

## 2015-07-13 MED ORDER — ALBUTEROL SULFATE HFA 108 (90 BASE) MCG/ACT IN AERS: 2.0000 | INHALATION_SPRAY | Freq: Four times a day (QID) | RESPIRATORY_TRACT | Status: DC | PRN

## 2015-07-13 MED ORDER — VARENICLINE TARTRATE 1 MG PO TABS: 1.0000 mg | ORAL_TABLET | Freq: Two times a day (BID) | ORAL | Status: DC

## 2015-07-13 MED ORDER — VARENICLINE TARTRATE 0.5 MG PO TABS: 0.5000 mg | ORAL_TABLET | Freq: Two times a day (BID) | ORAL | Status: DC

## 2015-07-13 MED ORDER — FLUTICASONE FUROATE-VILANTEROL 100-25 MCG/INH IN AEPB: 1.0000 | INHALATION_SPRAY | Freq: Every day | RESPIRATORY_TRACT | Status: DC

## 2015-07-13 NOTE — Telephone Encounter (Signed)
OK to interrupt anti platelet Rx for GI procedure

## 2015-07-13 NOTE — Progress Notes (Signed)
Patient ID: Brent Gonzales MRN: [id number], DOB: Feb 06, 1946, 70 y.o. Date of Encounter: @DATE @  Chief Complaint:  Chief Complaint  Patient presents with  . OTHER    pt SOB having to take deep breathes to catch his breath at least 10 breathes before its caught up, states has had symtoms for about a week but seems to be getting worse.    HPI: 70 y.o. year old white male  presents with above.   He states that the first night this occurred was about one week ago. Says that at that time it was evening time and he was getting in bed to go to sleep. Felt like he was not getting in enough air. Says that he then tried to take a purposeful deep breath but still just didn't feel like he was quite getting in as much air as usual. Got up and went to the recliner. Says that was the only night he went to the recliner. All the other nights since then he has been sleeping in his bed. He is not waking up from sleep feeling like he is smothering. No PND. Asked what he is doing during the day, exertion wise-- and if he's having symptoms during the day. He says that he played 18 holes of golf yesterday. Says that they did use the cart but still had to do some walking and swinging the golf club. He had no significant dyspnea on exertion. Is not feeling more short of breath than usual and was not having to stop his activity because of breathing. Even with this exertion had no chest pressure heaviness tightness or squeezing.  Does smoke. Half pack per day for about 50 years. Has decreased smoking this week because of the symptoms. Says he has a chronic smoker's cough and does cough some phlegm in the morning and does not think that the amount of this phlegm or cough has really increased recently. Thinks this has been fairly stable. No increased cough. Fevers or chills.   Past Medical History  Diagnosis Date  . Hyperlipidemia   . Tobacco abuse   . Carotid artery disease ([place])     right ICA stenosis  .  Hypertension   . High cholesterol   . Aorto-iliac disease ([place])     mild bilateral external. ABIs in the 0.9 range     Home Meds: Outpatient Prescriptions Prior to Visit  Medication Sig Dispense Refill  . aspirin 81 MG tablet Take 81 mg by mouth daily.    Marland Kitchen atorvastatin (LIPITOR) 40 MG tablet Take 1 tablet (40 mg total) by mouth daily. 365 tablet 0  . Cholecalciferol (VITAMIN D) 2000 UNITS CAPS Take 2,000 Units by mouth daily.    . clopidogrel (PLAVIX) 75 MG tablet Take 1 tablet (75 mg total) by mouth daily with breakfast. 365 tablet 0  . Coenzyme Q10 (CO Q10) 100 MG CAPS Take 200 mg by mouth daily.    Marland Kitchen glucosamine-chondroitin 500-400 MG tablet Take 1 tablet by mouth 2 (two) times daily.    . indomethacin (INDOCIN) 25 MG capsule Take 1 capsule (25 mg total) by mouth 3 (three) times daily as needed for mild pain. 30 capsule 0  . KRILL OIL PO Take 2 capsules by mouth daily.    . Multiple Vitamin (MULTIVITAMIN) capsule Take 1 capsule by mouth daily.    Marland Kitchen levofloxacin (LEVAQUIN) 500 MG tablet Take 1 tablet (500 mg total) by mouth daily. 7 tablet 0  . sulfamethoxazole-trimethoprim (BACTRIM DS,SEPTRA DS)  800-160 MG tablet Take 1 tablet by mouth 2 (two) times daily. 20 tablet 0   No facility-administered medications prior to visit.    Allergies:  Allergies  Allergen Reactions  . Penicillins     Social History   Social History  . Marital Status: Married    Spouse Name: N/A  . Number of Children: N/A  . Years of Education: N/A   Occupational History  . Not on file.   Social History Main Topics  . Smoking status: Current Every Day Smoker    Types: Cigarettes  . Smokeless tobacco: Never Used     Comment: weaning off cigs and using the e cig  . Alcohol Use: 0.6 oz/week    1 Cans of beer per week  . Drug Use: No  . Sexual Activity: Yes   Other Topics Concern  . Not on file   Social History Narrative    No family history on file.   Review of Systems:  See HPI for  pertinent ROS. All other ROS negative.    Physical Exam: Blood pressure 150/90, pulse 80, temperature 97.7 F (36.5 C), temperature source Oral, resp. rate 16, SpO2 94 %., There is no weight on file to calculate BMI. General: WNWD WM. Appears in no acute distress. Neck: Supple. No thyromegaly. No lymphadenopathy. Lungs: Clear bilaterally to auscultation without wheezes, rales, or rhonchi. Breathing is unlabored. No wheezing. Lungs are clear with good breath sounds. Heart: RRR with S1 S2. No murmurs, rubs, or gallops. Musculoskeletal:  Strength and tone normal for age. Extremities/Skin: Warm and dry. Neuro: Alert and oriented X 3. Moves all extremities spontaneously. Gait is normal. CNII-XII grossly in tact. Psych:  Responds to questions appropriately with a normal affect.   EKG shows Normal Sinus Rhythm. Non-Specific St-T wave changes.   ASSESSMENT AND PLAN:  70 y.o. year old male with  1. Shortness of breath EKG shows Normal Sinus Rhythm. Non-Specific St-T wave changes.   Obtain chest x-ray. We'll obtain labs in follow-up those results.  Reviewed his notes by Dr. Gwenlyn Found. Patient has known peripheral arterial disease. He had abnormal Myoview December 2011. But was asymptomatic so has been treated medically. Given his current symptoms and his risk factors for cardiac disease will refer him back to Dr. Gwenlyn Found for follow-up evaluation.  Also will start medications to treat COPD. He is to start Breo daily and is to add albuterol 2 puffs every 4-6 hours PRN I provided one sample Breo to him today.   Discussed smoking cessation but he says that he does not want to use Chantix because he is concerned of adverse effects. He is to work on smoking cessation and decreasing on his own.  Will schedule follow-up visit here in one month. Will follow-up with him once I get results of labs and chest x-ray as well.   - DG Chest 2 View; Future - COMPLETE METABOLIC PANEL WITH GFR - CBC with  Differential/Platelet - Brain natriuretic peptide - Ambulatory referral to Cardiology - Troponin I - fluticasone furoate-vilanterol (BREO ELLIPTA) 100-25 MCG/INH AEPB; Inhale 1 puff into the lungs daily.  Dispense: 1 each; Refill: 2 - EKG 12-Lead  2. Pulmonary emphysema, unspecified emphysema type (HCC) - fluticasone furoate-vilanterol (BREO ELLIPTA) 100-25 MCG/INH AEPB; Inhale 1 puff into the lungs daily.  Dispense: 1 each; Refill: 2 - albuterol (PROVENTIL HFA;VENTOLIN HFA) 108 (90 Base) MCG/ACT inhaler; Inhale 2 puffs into the lungs every 6 (six) hours as needed for wheezing or shortness of breath.  Dispense:  1 Inhaler; Refill: 0  3. Smoker - varenicline (CHANTIX) 0.5 MG tablet; Take 1 tablet (0.5 mg total) by mouth 2 (two) times daily.  Dispense: 60 tablet; Refill: 0 - varenicline (CHANTIX CONTINUING MONTH PAK) 1 MG tablet; Take 1 tablet (1 mg total) by mouth 2 (two) times daily.  Dispense: 60 tablet; Refill: 3  4. Essential hypertension Stable/Controlled.  5. Hyperlipidemia On Statin. Stable/Controlled  6. Tobacco abuse See # 1 above regarding smoking cessation  7. Bilateral carotid artery disease ([place]) --Managed by Dr. Gwenlyn Found  8. Peripheral arterial disease [facility]) --Managed by Dr. Gwenlyn Found   Signed, [facility] [place], [place], [facility] 07/13/2015 1:48 PM

## 2015-07-13 NOTE — Telephone Encounter (Signed)
Requesting surgical clearance:   1. Type of surgery: colonoscopy  2. Surgeon: Dr. Benson Norway  3. Surgical date: 07/28/15  4. Medications that need to be held: HOLD Plavix for 5 days  5. CAD: no     6. I will defer to: berry   Dr. Carol Ada - [facility] Attn: U[place]ses [place]ward

## 2015-07-14 LAB — TROPONIN I: Troponin I: <0.01 ng/mL (ref <=0.05)

## 2015-07-14 LAB — BRAIN NATRIURETIC PEPTIDE: BRAIN NATRIURETIC PEPTIDE: 21.2 pg/mL (ref <100)

## 2015-07-14 NOTE — Telephone Encounter (Signed)
Pt clearance sent to [place]: [place] phone

## 2015-07-28 DIAGNOSIS — K635 Polyp of colon: Secondary | ICD-10-CM | POA: Diagnosis not present

## 2015-07-28 DIAGNOSIS — K5520 Angiodysplasia of colon without hemorrhage: Secondary | ICD-10-CM | POA: Diagnosis not present

## 2015-07-28 DIAGNOSIS — D122 Benign neoplasm of ascending colon: Secondary | ICD-10-CM | POA: Diagnosis not present

## 2015-07-28 DIAGNOSIS — Z1211 Encounter for screening for malignant neoplasm of colon: Secondary | ICD-10-CM | POA: Diagnosis not present

## 2015-07-28 DIAGNOSIS — K5730 Diverticulosis of large intestine without perforation or abscess without bleeding: Secondary | ICD-10-CM | POA: Diagnosis not present

## 2015-07-28 DIAGNOSIS — D123 Benign neoplasm of transverse colon: Secondary | ICD-10-CM | POA: Diagnosis not present

## 2015-07-28 DIAGNOSIS — Z86010 Personal history of colonic polyps: Secondary | ICD-10-CM | POA: Diagnosis not present

## 2015-07-28 DIAGNOSIS — D124 Benign neoplasm of descending colon: Secondary | ICD-10-CM | POA: Diagnosis not present

## 2015-08-08 ENCOUNTER — Encounter: Payer: Self-pay | Admitting: Cardiovascular Disease

## 2015-08-08 ENCOUNTER — Telehealth: Payer: Self-pay | Admitting: Cardiovascular Disease

## 2015-08-09 NOTE — Telephone Encounter (Signed)
Closed encounter °

## 2015-08-15 ENCOUNTER — Ambulatory Visit (INDEPENDENT_AMBULATORY_CARE_PROVIDER_SITE_OTHER): Payer: Medicare Other | Admitting: Family Medicine

## 2015-08-15 ENCOUNTER — Encounter: Payer: Self-pay | Admitting: Family Medicine

## 2015-08-15 VITALS — BP 130/76 | HR 86 | Temp 98.0°F | Resp 18 | Ht 74.0 in | Wt 191.0 lb

## 2015-08-15 DIAGNOSIS — R0602 Shortness of breath: Secondary | ICD-10-CM | POA: Diagnosis not present

## 2015-08-15 DIAGNOSIS — J439 Emphysema, unspecified: Secondary | ICD-10-CM | POA: Diagnosis not present

## 2015-08-15 NOTE — Progress Notes (Signed)
Subjective:    Patient ID: Brent Gonzales, male    DOB: Apr 11, 1946, 70 y.o.   MRN: [id number]  HPI  06/27/15 Right foot is red hot swollen and painful. The skin is erythematous and tender to the touch on the dorsum of the right foot up to the ankle. It has been gradually progressing over the last week. However past medical history is also complicated by gout. He denies any trauma or puncture wounds to the foot.  At that time, my plan was: I believe this is cellulitis, gout is less likely. Begin Bactrim double strength 1 by mouth twice a day for 10 days. He can use indomethacin 25 mg every 8 hours as needed for pain and swelling. Recheck in 48 hours or sooner if worse  08/15/15 The patient saw Olean Ree since I last saw him for shortness of breath. At that time she started him on Breo for COPD. I reviewed that office visit. Chest x-ray was unremarkable except for COPD. Hemoglobin was normal. BNP was normal. Patient reports shortness of breath with exertion. He also reports shortness of breath at rest. He denies any chest pain or angina. He denies any nausea or vomiting. He denies any diaphoresis. Since beginning the medication, his breathing has improved somewhat. He deathly notices significant benefit with albuterol. Unfortunately he is still smoking Past Medical History  Diagnosis Date  . Hyperlipidemia   . Tobacco abuse   . Carotid artery disease ([place])     right ICA stenosis  . Hypertension   . High cholesterol   . Aorto-iliac disease ([place])     mild bilateral external. ABIs in the 0.9 range   Past Surgical History  Procedure Laterality Date  . Tonsillectomy and adenoidectomy    . Hernia repair      left and right  . Nm myoview ltd  03/2010    Inferior septal ischemia  . Duplex ultrasound    . Doppler echocardiography    . Stress myocardial perfusion     Current Outpatient Prescriptions on File Prior to Visit  Medication Sig Dispense Refill  . albuterol (PROVENTIL HFA;VENTOLIN  HFA) 108 (90 Base) MCG/ACT inhaler Inhale 2 puffs into the lungs every 6 (six) hours as needed for wheezing or shortness of breath. 1 Inhaler 0  . aspirin 81 MG tablet Take 81 mg by mouth daily.    Marland Kitchen atorvastatin (LIPITOR) 40 MG tablet Take 1 tablet (40 mg total) by mouth daily. 365 tablet 0  . Cholecalciferol (VITAMIN D) 2000 UNITS CAPS Take 2,000 Units by mouth daily.    . clopidogrel (PLAVIX) 75 MG tablet Take 1 tablet (75 mg total) by mouth daily with breakfast. 365 tablet 0  . Coenzyme Q10 (CO Q10) 100 MG CAPS Take 200 mg by mouth daily.    . fluticasone furoate-vilanterol (BREO ELLIPTA) 100-25 MCG/INH AEPB Inhale 1 puff into the lungs daily. 1 each 2  . glucosamine-chondroitin 500-400 MG tablet Take 1 tablet by mouth 2 (two) times daily.    . indomethacin (INDOCIN) 25 MG capsule Take 1 capsule (25 mg total) by mouth 3 (three) times daily as needed for mild pain. 30 capsule 0  . KRILL OIL PO Take 2 capsules by mouth daily.    . Multiple Vitamin (MULTIVITAMIN) capsule Take 1 capsule by mouth daily.    . varenicline (CHANTIX CONTINUING MONTH PAK) 1 MG tablet Take 1 tablet (1 mg total) by mouth 2 (two) times daily. 60 tablet 3  . varenicline (CHANTIX) 0.5  MG tablet Take 1 tablet (0.5 mg total) by mouth 2 (two) times daily. 60 tablet 0   No current facility-administered medications on file prior to visit.   Allergies  Allergen Reactions  . Penicillins    Social History   Social History  . Marital Status: Married    Spouse Name: N/A  . Number of Children: N/A  . Years of Education: N/A   Occupational History  . Not on file.   Social History Main Topics  . Smoking status: Current Every Day Smoker    Types: Cigarettes  . Smokeless tobacco: Never Used     Comment: weaning off cigs and using the e cig  . Alcohol Use: 0.6 oz/week    1 Cans of beer per week  . Drug Use: No  . Sexual Activity: Yes   Other Topics Concern  . Not on file   Social History Narrative     Review of  Systems  All other systems reviewed and are negative.      Objective:   Physical Exam  Cardiovascular: Normal rate and regular rhythm.   Pulmonary/Chest: Effort normal and breath sounds normal.  Musculoskeletal:       Right foot: There is swelling. There is normal range of motion, no tenderness and no bony tenderness.  Skin: Skin is warm. There is erythema.  Vitals reviewed.         Assessment & Plan:  COPD I am unable to perform pulmonary function testing today due to an air with our machine however I anticipate that the patient likely has stage III COPD. I would like to continue Breo but also add spiriva respimat  2.5 mcg inhalation, 2 inhalations once a day. Recheck in one month to see how his breathing is doing and to make further adjustments

## 2015-08-17 ENCOUNTER — Ambulatory Visit: Payer: Medicare Other | Admitting: Cardiovascular Disease

## 2015-08-23 ENCOUNTER — Ambulatory Visit: Payer: Medicare Other | Admitting: Cardiovascular Disease

## 2015-08-24 ENCOUNTER — Encounter: Payer: Self-pay | Admitting: Cardiovascular Disease

## 2015-09-02 ENCOUNTER — Ambulatory Visit (INDEPENDENT_AMBULATORY_CARE_PROVIDER_SITE_OTHER): Payer: Medicare Other | Admitting: Cardiovascular Disease

## 2015-09-02 ENCOUNTER — Encounter: Payer: Self-pay | Admitting: Cardiovascular Disease

## 2015-09-02 VITALS — BP 131/70 | HR 80 | Ht 74.0 in | Wt 190.0 lb

## 2015-09-02 DIAGNOSIS — I10 Essential (primary) hypertension: Secondary | ICD-10-CM

## 2015-09-02 DIAGNOSIS — I779 Disorder of arteries and arterioles, unspecified: Secondary | ICD-10-CM | POA: Diagnosis not present

## 2015-09-02 DIAGNOSIS — E785 Hyperlipidemia, unspecified: Secondary | ICD-10-CM | POA: Diagnosis not present

## 2015-09-02 DIAGNOSIS — Z720 Tobacco use: Secondary | ICD-10-CM

## 2015-09-02 DIAGNOSIS — I739 Peripheral vascular disease, unspecified: Secondary | ICD-10-CM

## 2015-09-02 NOTE — Assessment & Plan Note (Signed)
History of moderate bilateral internal carotid artery stenosis by duplex ultrasound recently checked 04/08/15. This will be repeated in 2 years

## 2015-09-02 NOTE — Assessment & Plan Note (Signed)
History of hyperlipidemia on statin therapy with recent lipid profile performed 03/31/15 revealing a total cholesterol 124, LDL 67 and HDL of 32

## 2015-09-02 NOTE — Assessment & Plan Note (Signed)
History of hypertension blood pressure measured at 131/70. He is not on antihypertensive medications

## 2015-09-02 NOTE — Assessment & Plan Note (Signed)
History of mild peripheral arterial disease with fairly normal ABIs and mild iliac disease. The patient denies claudication.

## 2015-09-02 NOTE — Progress Notes (Signed)
09/02/2015 Brent Gonzales   08-Jun-1945  [id number]  Primary Physician Odette Fraction, MD Primary Cardiologist: Lorretta Harp MD Renae Gloss   HPI:  The patient is a 70 year old, thin-appearing, married Caucasian male father to 3 stepchildren who I last saw 03/25/15 He has a history of continued tobacco abuse 1/2 pack per day although he is trying to cut this down and has transitioned to an E. cigarette, treated hypertension and hyperlipidemia. He has moderate carotid disease with a right ICA stenosis that has remained stable by ultrasound as recently as 03/10/14.Marland Kitchen He also has mild bilateral external iliac disease by duplex ultrasound with normal ABIs, though, he denies claudication but does complain of some left hip pain.. Myoview performed December 2011 showed inferior septal ischemia that we decided to treat conservatively since he is asymptomatic and Dr. Dennard Schaumann follows his lipid profile which was recently performed 03/31/15 revealing total cholesterol 124, LDL 67 and HDL of 32.Marland Kitchen He has had not chest or shortness of breath since I last saw him.   Current Outpatient Prescriptions  Medication Sig Dispense Refill  . albuterol (PROVENTIL HFA;VENTOLIN HFA) 108 (90 Base) MCG/ACT inhaler Inhale 2 puffs into the lungs every 6 (six) hours as needed for wheezing or shortness of breath. 1 Inhaler 0  . aspirin 81 MG tablet Take 81 mg by mouth daily.    Marland Kitchen atorvastatin (LIPITOR) 40 MG tablet Take 1 tablet (40 mg total) by mouth daily. 365 tablet 0  . Cholecalciferol (VITAMIN D) 2000 UNITS CAPS Take 2,000 Units by mouth daily.    . clopidogrel (PLAVIX) 75 MG tablet Take 1 tablet (75 mg total) by mouth daily with breakfast. 365 tablet 0  . Coenzyme Q10 (CO Q10) 100 MG CAPS Take 200 mg by mouth daily.    . fluticasone furoate-vilanterol (BREO ELLIPTA) 100-25 MCG/INH AEPB Inhale 1 puff into the lungs daily. 1 each 2  . glucosamine-chondroitin 500-400 MG tablet Take 1 tablet by mouth 2  (two) times daily.    Marland Kitchen KRILL OIL PO Take 2 capsules by mouth daily.    . Multiple Vitamin (MULTIVITAMIN) capsule Take 1 capsule by mouth daily.     No current facility-administered medications for this visit.    Allergies  Allergen Reactions  . Penicillins     Social History   Social History  . Marital Status: Married    Spouse Name: N/A  . Number of Children: N/A  . Years of Education: N/A   Occupational History  . Not on file.   Social History Main Topics  . Smoking status: Current Every Day Smoker    Types: Cigarettes  . Smokeless tobacco: Never Used     Comment: weaning off cigs and using the e cig  . Alcohol Use: 0.6 oz/week    1 Cans of beer per week  . Drug Use: No  . Sexual Activity: Yes   Other Topics Concern  . Not on file   Social History Narrative     Review of Systems: General: negative for chills, fever, night sweats or weight changes.  Cardiovascular: negative for chest pain, dyspnea on exertion, edema, orthopnea, palpitations, paroxysmal nocturnal dyspnea or shortness of breath Dermatological: negative for rash Respiratory: negative for cough or wheezing Urologic: negative for hematuria Abdominal: negative for nausea, vomiting, diarrhea, bright red blood per rectum, melena, or hematemesis Neurologic: negative for visual changes, syncope, or dizziness All other systems reviewed and are otherwise negative except as noted above.    Blood pressure  131/70, pulse 80, height 6\' 2"  (1.88 m), weight 190 lb (86.183 kg).  General appearance: alert and no distress Neck: no adenopathy, no JVD, supple, symmetrical, trachea midline, thyroid not enlarged, symmetric, no tenderness/mass/nodules and soft bilateral carotid bruits Lungs: clear to auscultation bilaterally Heart: regular rate and rhythm, S1, S2 normal, no murmur, click, rub or gallop Extremities: extremities normal, atraumatic, no cyanosis or edema  EKG not performed today  ASSESSMENT AND PLAN:     Essential hypertension History of hypertension blood pressure measured at 131/70. He is not on antihypertensive medications  Hyperlipidemia History of hyperlipidemia on statin therapy with recent lipid profile performed 03/31/15 revealing a total cholesterol 124, LDL 67 and HDL of 32  Tobacco abuse History of continued tobacco abuse down from one half pack per day 6 months ago to 3-4 cigarettes a day with the intent to stop  Carotid artery disease History of moderate bilateral internal carotid artery stenosis by duplex ultrasound recently checked 04/08/15. This will be repeated in 2 years  Peripheral arterial disease History of mild peripheral arterial disease with fairly normal ABIs and mild iliac disease. The patient denies claudication.      Lorretta Harp MD FACP,FACC,FAHA, [facility] 09/02/2015 10:29 AM

## 2015-09-02 NOTE — Assessment & Plan Note (Signed)
History of continued tobacco abuse down from one half pack per day 6 months ago to 3-4 cigarettes a day with the intent to stop

## 2015-09-02 NOTE — Patient Instructions (Signed)

## 2015-09-05 ENCOUNTER — Other Ambulatory Visit: Payer: Self-pay | Admitting: Family Medicine

## 2015-09-05 NOTE — Telephone Encounter (Signed)
Refill appropriate and filled per protocol. 

## 2015-09-08 ENCOUNTER — Ambulatory Visit (INDEPENDENT_AMBULATORY_CARE_PROVIDER_SITE_OTHER): Payer: Medicare Other | Admitting: Family Medicine

## 2015-09-08 ENCOUNTER — Encounter: Payer: Self-pay | Admitting: Family Medicine

## 2015-09-08 VITALS — BP 140/76 | HR 72 | Temp 98.5°F | Resp 18 | Ht 74.0 in | Wt 188.0 lb

## 2015-09-08 DIAGNOSIS — J439 Emphysema, unspecified: Secondary | ICD-10-CM

## 2015-09-08 MED ORDER — ALBUTEROL SULFATE HFA 108 (90 BASE) MCG/ACT IN AERS: 2.0000 | INHALATION_SPRAY | Freq: Four times a day (QID) | RESPIRATORY_TRACT | Status: DC | PRN

## 2015-09-08 NOTE — Progress Notes (Signed)
Subjective:    Patient ID: Brent Gonzales, male    DOB: 1946/04/06, 70 y.o.   MRN: [id number]  HPI  06/27/15 Right foot is red hot swollen and painful. The skin is erythematous and tender to the touch on the dorsum of the right foot up to the ankle. It has been gradually progressing over the last week. However past medical history is also complicated by gout. He denies any trauma or puncture wounds to the foot.  At that time, my plan was: I believe this is cellulitis, gout is less likely. Begin Bactrim double strength 1 by mouth twice a day for 10 days. He can use indomethacin 25 mg every 8 hours as needed for pain and swelling. Recheck in 48 hours or sooner if worse  08/15/15 The patient saw Olean Ree since I last saw him for shortness of breath. At that time she started him on Breo for COPD. I reviewed that office visit. Chest x-ray was unremarkable except for COPD. Hemoglobin was normal. BNP was normal. Patient reports shortness of breath with exertion. He also reports shortness of breath at rest. He denies any chest pain or angina. He denies any nausea or vomiting. He denies any diaphoresis. Since beginning the medication, his breathing has improved somewhat. He deathly notices significant benefit with albuterol. Unfortunately he is still smoking.  At that time, my plan was: COPD I am unable to perform pulmonary function testing today due to an error with our machine however I anticipate that the patient likely has stage III COPD. I would like to continue Breo but also add spiriva respimat  2.5 mcg inhalation, 2 inhalations once a day. Recheck in one month to see how his breathing is doing and to make further adjustments  09/08/15 The patient's breathing is better. He is trying to wean away from cigarettes. He no longer has dyspnea at rest. He does occasionally have to use albuterol perhaps once a day for wheezing and sob with activity. He denies any hemoptysis. He denies any fevers or chills. He  denies any pleurisy Past Medical History  Diagnosis Date  . Hyperlipidemia   . Tobacco abuse   . Carotid artery disease ([place])     right ICA stenosis  . Hypertension   . High cholesterol   . Aorto-iliac disease ([place])     mild bilateral external. ABIs in the 0.9 range   Past Surgical History  Procedure Laterality Date  . Tonsillectomy and adenoidectomy    . Hernia repair      left and right  . Nm myoview ltd  03/2010    Inferior septal ischemia  . Duplex ultrasound    . Doppler echocardiography    . Stress myocardial perfusion     Current Outpatient Prescriptions on File Prior to Visit  Medication Sig Dispense Refill  . albuterol (PROVENTIL HFA;VENTOLIN HFA) 108 (90 Base) MCG/ACT inhaler Inhale 2 puffs into the lungs every 6 (six) hours as needed for wheezing or shortness of breath. 1 Inhaler 0  . aspirin 81 MG tablet Take 81 mg by mouth daily.    Marland Kitchen atorvastatin (LIPITOR) 40 MG tablet TAKE 1 TABLET (40 MG TOTAL) BY MOUTH DAILY. 360 tablet 0  . Cholecalciferol (VITAMIN D) 2000 UNITS CAPS Take 2,000 Units by mouth daily.    . clopidogrel (PLAVIX) 75 MG tablet Take 1 tablet (75 mg total) by mouth daily with breakfast. 365 tablet 0  . Coenzyme Q10 (CO Q10) 100 MG CAPS Take 200 mg by  mouth daily.    . fluticasone furoate-vilanterol (BREO ELLIPTA) 100-25 MCG/INH AEPB Inhale 1 puff into the lungs daily. 1 each 2  . glucosamine-chondroitin 500-400 MG tablet Take 1 tablet by mouth 2 (two) times daily.    Marland Kitchen KRILL OIL PO Take 2 capsules by mouth daily.    . Multiple Vitamin (MULTIVITAMIN) capsule Take 1 capsule by mouth daily.     No current facility-administered medications on file prior to visit.   Allergies  Allergen Reactions  . Penicillins    Social History   Social History  . Marital Status: Married    Spouse Name: N/A  . Number of Children: N/A  . Years of Education: N/A   Occupational History  . Not on file.   Social History Main Topics  . Smoking status: Current  Every Day Smoker    Types: Cigarettes  . Smokeless tobacco: Never Used     Comment: weaning off cigs and using the e cig  . Alcohol Use: 0.6 oz/week    1 Cans of beer per week  . Drug Use: No  . Sexual Activity: Yes   Other Topics Concern  . Not on file   Social History Narrative     Review of Systems  All other systems reviewed and are negative.      Objective:   Physical Exam  HENT:  Nose: Nose normal.  Mouth/Throat: Oropharynx is clear and moist. No oropharyngeal exudate.  Cardiovascular: Normal rate and regular rhythm.   Pulmonary/Chest: Effort normal and breath sounds normal. No respiratory distress. He has no wheezes. He has no rales.  Abdominal: Soft. Bowel sounds are normal.  Lymphadenopathy:    He has no cervical adenopathy.  Vitals reviewed.         Assessment & Plan:  COPD Switch breo and spiriva to anoro one inhalation daily for cost savings. Continue to encourage smoking cessation.

## 2015-10-13 ENCOUNTER — Other Ambulatory Visit: Payer: Self-pay | Admitting: Family Medicine

## 2015-10-13 NOTE — Telephone Encounter (Signed)
Pt is calling to see if we have any more samples of Anoro.  [phone]

## 2015-10-14 NOTE — Telephone Encounter (Signed)
Samples at front desk and pt aware

## 2015-10-24 ENCOUNTER — Telehealth: Payer: Self-pay | Admitting: Family Medicine

## 2015-10-24 NOTE — Telephone Encounter (Addendum)
Pt has been given samples of Anoro and is almost out of his last sample. He needs to know if Dr Dennard Schaumann is going to call in Anoro or another medication so that he can check with his insurance company.

## 2015-10-27 ENCOUNTER — Other Ambulatory Visit: Payer: Self-pay | Admitting: *Deleted

## 2015-10-27 DIAGNOSIS — J439 Emphysema, unspecified: Secondary | ICD-10-CM

## 2015-10-27 MED ORDER — UMECLIDINIUM-VILANTEROL 62.5-25 MCG/INH IN AEPB: 1.0000 | INHALATION_SPRAY | Freq: Every day | RESPIRATORY_TRACT | Status: DC

## 2015-10-27 MED ORDER — ALBUTEROL SULFATE HFA 108 (90 BASE) MCG/ACT IN AERS: 2.0000 | INHALATION_SPRAY | Freq: Four times a day (QID) | RESPIRATORY_TRACT | Status: DC | PRN

## 2015-10-27 NOTE — Telephone Encounter (Signed)
Continue anoro if helping.

## 2015-10-27 NOTE — Telephone Encounter (Signed)
Spoke to pt and he is aware and sent medication to pharm and he will call if they do not cover them.

## 2015-10-27 NOTE — Telephone Encounter (Signed)
Received fax requesting refill on Anoro and ProAir.   Refill appropriate and filled per protocol.

## 2015-11-02 ENCOUNTER — Other Ambulatory Visit: Payer: Self-pay | Admitting: Family Medicine

## 2015-11-02 DIAGNOSIS — J439 Emphysema, unspecified: Secondary | ICD-10-CM

## 2015-11-02 MED ORDER — UMECLIDINIUM-VILANTEROL 62.5-25 MCG/INH IN AEPB: 1.0000 | INHALATION_SPRAY | Freq: Every day | RESPIRATORY_TRACT | Status: DC

## 2015-11-02 MED ORDER — ALBUTEROL SULFATE HFA 108 (90 BASE) MCG/ACT IN AERS: 2.0000 | INHALATION_SPRAY | Freq: Four times a day (QID) | RESPIRATORY_TRACT | Status: DC | PRN

## 2015-11-02 NOTE — Telephone Encounter (Signed)
Medication called/sent to requested pharmacy  

## 2016-04-11 ENCOUNTER — Other Ambulatory Visit: Payer: Self-pay | Admitting: Cardiovascular Disease

## 2016-04-11 ENCOUNTER — Ambulatory Visit (HOSPITAL_COMMUNITY)
Admission: RE | Admit: 2016-04-11 | Discharge: 2016-04-11 | Disposition: A | Discharge status: Home / Self Care | Payer: Medicare Other | Source: Ambulatory Visit | Attending: Cardiovascular Disease | Admitting: Cardiovascular Disease

## 2016-04-11 DIAGNOSIS — Z720 Tobacco use: Secondary | ICD-10-CM

## 2016-04-11 DIAGNOSIS — I77811 Abdominal aortic ectasia: Secondary | ICD-10-CM | POA: Insufficient documentation

## 2016-04-11 DIAGNOSIS — I779 Disorder of arteries and arterioles, unspecified: Secondary | ICD-10-CM

## 2016-04-11 DIAGNOSIS — I739 Peripheral vascular disease, unspecified: Secondary | ICD-10-CM | POA: Diagnosis not present

## 2016-04-11 DIAGNOSIS — E785 Hyperlipidemia, unspecified: Secondary | ICD-10-CM

## 2016-04-11 DIAGNOSIS — I10 Essential (primary) hypertension: Secondary | ICD-10-CM

## 2016-04-11 DIAGNOSIS — I6523 Occlusion and stenosis of bilateral carotid arteries: Secondary | ICD-10-CM

## 2016-05-28 ENCOUNTER — Other Ambulatory Visit: Payer: Self-pay | Admitting: Cardiovascular Disease

## 2016-05-28 MED ORDER — CLOPIDOGREL BISULFATE 75 MG PO TABS: 75.0000 mg | ORAL_TABLET | Freq: Every day | ORAL | 0 refills | Status: DC

## 2016-05-28 NOTE — Telephone Encounter (Signed)
Rx(s) sent to pharmacy electronically.  

## 2016-05-28 NOTE — Telephone Encounter (Signed)
New Message      *STAT* If patient is at the pharmacy, call can be transferred to refill team.   1. Which medications need to be refilled? (please list name of each medication and dose if known)  clopidogrel (PLAVIX) 75 MG tablet Take 1 tablet (75 mg total) by mouth daily with breakfast.     2. Which pharmacy/location (including street and city if local pharmacy) is medication to be sent to? [facility] -[place]   3. Do they need a 30 day or 90 day supply? [id number]    Pharmacy said they have called several times for this refill , pt is out

## 2016-06-13 ENCOUNTER — Encounter (HOSPITAL_COMMUNITY): Payer: Self-pay | Admitting: *Deleted

## 2016-06-13 ENCOUNTER — Emergency Department (HOSPITAL_COMMUNITY)
Admission: EM | Admit: 2016-06-13 | Discharge: 2016-06-13 | Disposition: A | Discharge status: Home / Self Care | Payer: Medicare Other | Attending: Emergency Medicine | Admitting: Emergency Medicine

## 2016-06-13 ENCOUNTER — Emergency Department (HOSPITAL_COMMUNITY): Payer: Medicare Other

## 2016-06-13 DIAGNOSIS — J069 Acute upper respiratory infection, unspecified: Secondary | ICD-10-CM | POA: Insufficient documentation

## 2016-06-13 DIAGNOSIS — I2510 Atherosclerotic heart disease of native coronary artery without angina pectoris: Secondary | ICD-10-CM | POA: Insufficient documentation

## 2016-06-13 DIAGNOSIS — J449 Chronic obstructive pulmonary disease, unspecified: Secondary | ICD-10-CM | POA: Insufficient documentation

## 2016-06-13 DIAGNOSIS — Z7982 Long term (current) use of aspirin: Secondary | ICD-10-CM | POA: Diagnosis not present

## 2016-06-13 DIAGNOSIS — Z79899 Other long term (current) drug therapy: Secondary | ICD-10-CM | POA: Diagnosis not present

## 2016-06-13 DIAGNOSIS — R0602 Shortness of breath: Secondary | ICD-10-CM | POA: Diagnosis not present

## 2016-06-13 DIAGNOSIS — I10 Essential (primary) hypertension: Secondary | ICD-10-CM | POA: Diagnosis not present

## 2016-06-13 DIAGNOSIS — F17210 Nicotine dependence, cigarettes, uncomplicated: Secondary | ICD-10-CM | POA: Diagnosis not present

## 2016-06-13 DIAGNOSIS — R062 Wheezing: Secondary | ICD-10-CM | POA: Diagnosis present

## 2016-06-13 DIAGNOSIS — R05 Cough: Secondary | ICD-10-CM | POA: Diagnosis not present

## 2016-06-13 HISTORY — DX: Atherosclerotic heart disease of native coronary artery without angina pectoris: I25.10

## 2016-06-13 HISTORY — DX: Chronic obstructive pulmonary disease, unspecified: J44.9

## 2016-06-13 LAB — BASIC METABOLIC PANEL
Anion gap: 10 (ref 5–15)
BUN: 11 mg/dL (ref 6–20)
CO2: 24 mmol/L (ref 22–32)
Calcium: 9.2 mg/dL (ref 8.9–10.3)
Chloride: 107 mmol/L (ref 101–111)
Creatinine, Ser: 0.98 mg/dL (ref 0.61–1.24)
GFR calc Af Amer: >60 mL/min (ref >60)
GFR calc non Af Amer: >60 mL/min (ref >60)
Glucose, Bld: 133 mg/dL — ABNORMAL HIGH (ref 65–99)
Potassium: 3.8 mmol/L (ref 3.5–5.1)
Sodium: 141 mmol/L (ref 135–145)

## 2016-06-13 LAB — CBC WITH DIFFERENTIAL/PLATELET
Basophils Absolute: 0 10*3/uL (ref 0.0–0.1)
Basophils Relative: 0 %
Eosinophils Absolute: 0.5 10*3/uL (ref 0.0–0.7)
Eosinophils Relative: 4 %
HCT: 45.1 % (ref 39.0–52.0)
Hemoglobin: 15.3 g/dL (ref 13.0–17.0)
Lymphocytes Relative: 15 %
Lymphs Abs: 1.9 10*3/uL (ref 0.7–4.0)
MCH: 32.6 pg (ref 26.0–34.0)
MCHC: 33.9 g/dL (ref 30.0–36.0)
MCV: 96.2 fL (ref 78.0–100.0)
Monocytes Absolute: 0.7 10*3/uL (ref 0.1–1.0)
Monocytes Relative: 6 %
Neutro Abs: 9.4 10*3/uL — ABNORMAL HIGH (ref 1.7–7.7)
Neutrophils Relative %: 75 %
Platelets: 203 10*3/uL (ref 150–400)
RBC: 4.69 MIL/uL (ref 4.22–5.81)
RDW: 13.1 % (ref 11.5–15.5)
WBC: 12.5 10*3/uL — ABNORMAL HIGH (ref 4.0–10.5)

## 2016-06-13 MED ORDER — ALBUTEROL SULFATE (2.5 MG/3ML) 0.083% IN NEBU
5.0000 mg | INHALATION_SOLUTION | Freq: Once | RESPIRATORY_TRACT | Status: AC
  Administered 2016-06-13: 5 mg via RESPIRATORY_TRACT
  Filled 2016-06-13: qty 6

## 2016-06-13 MED ORDER — ALBUTEROL (5 MG/ML) CONTINUOUS INHALATION SOLN
15.0000 mg/h | INHALATION_SOLUTION | RESPIRATORY_TRACT | Status: AC
  Administered 2016-06-13: 15 mg/h via RESPIRATORY_TRACT
  Filled 2016-06-13: qty 20

## 2016-06-13 MED ORDER — GUAIFENESIN ER 1200 MG PO TB12: 1.0000 | ORAL_TABLET | Freq: Two times a day (BID) | ORAL | 0 refills | Status: DC

## 2016-06-13 MED ORDER — DOXYCYCLINE HYCLATE 100 MG PO CAPS: 100.0000 mg | ORAL_CAPSULE | Freq: Two times a day (BID) | ORAL | 0 refills | Status: DC

## 2016-06-13 MED ORDER — IPRATROPIUM BROMIDE 0.02 % IN SOLN
0.5000 mg | RESPIRATORY_TRACT | Status: AC
  Administered 2016-06-13: 0.5 mg via RESPIRATORY_TRACT
  Filled 2016-06-13: qty 2.5

## 2016-06-13 MED ORDER — PROMETHAZINE-DM 6.25-15 MG/5ML PO SYRP: 5.0000 mL | ORAL_SOLUTION | Freq: Four times a day (QID) | ORAL | 0 refills | Status: DC | PRN

## 2016-06-13 MED ORDER — METHYLPREDNISOLONE SODIUM SUCC 125 MG IJ SOLR
125.0000 mg | Freq: Once | INTRAMUSCULAR | Status: AC
  Administered 2016-06-13: 125 mg via INTRAVENOUS
  Filled 2016-06-13: qty 2

## 2016-06-13 MED ORDER — PREDNISONE 50 MG PO TABS: 50.0000 mg | ORAL_TABLET | Freq: Every day | ORAL | 0 refills | Status: DC

## 2016-06-13 NOTE — ED Provider Notes (Signed)
The patient is a 71 year old male with a known history of COPD who is at approximately 5 days of shortness of breath. He reports that on Monday he was having lots of productive coughing but this has since become nonproductive. He has had increased shortness of breath and on exam the patient does have diffuse mild to moderate wheezing. His not febrile, he had minimal tachycardia on arrival to my hour-long nebulized treatment given, the patient has an x-ray showing no signs of pneumonia or pneumothorax. Anticipate discharge with prednisone. The patient does report that he has not had prednisone in as long as he can remember.  Medical screening examination/treatment/procedure(s) were conducted as a shared visit with non-physician practitioner(s) and myself.  I personally evaluated the patient during the encounter.  Clinical Impression:   Final diagnoses:  Acute upper respiratory infection  Chronic obstructive pulmonary disease, unspecified COPD type (HCC)         Noemi Chapel, MD 06/21/16 1550

## 2016-06-13 NOTE — Discharge Instructions (Signed)
Return here as needed. Follow up with your PCP.  °

## 2016-06-13 NOTE — ED Notes (Signed)
Called RT for continuous neb.

## 2016-06-13 NOTE — ED Provider Notes (Signed)
[facility] DEPT Provider Note   CSN: [id number] Arrival date & time: 06/13/16  0406     History   Chief Complaint Chief Complaint  Patient presents with  . Cough  . Nasal Congestion    HPI Brent Gonzales is a 71 y.o. male.  HPI Patient presents to the emergency department with cough, nasal congestion and wheezing over the last 2 days.  Patient states that he does have COPD and feels that he has had more wheezing.  Patient states nothing seems make the condition better or worse. The patient denies chest pain, headache,blurred vision, neck pain, fever,  weakness, numbness, dizziness, anorexia, edema, abdominal pain, nausea, vomiting, diarrhea, rash, back pain, dysuria, hematemesis, bloody stool, near syncope, or syncope. Past Medical History:  Diagnosis Date  . Aorto-iliac disease ([place])    mild bilateral external. ABIs in the 0.9 range  . Carotid artery disease ([place])    right ICA stenosis  . COPD (chronic obstructive pulmonary disease) ([place])   . Coronary artery disease   . High cholesterol   . Hyperlipidemia   . Hypertension   . Tobacco abuse     Patient Active Problem List   Diagnosis Date Noted  . Peripheral arterial disease ([place]) 03/24/2014  . Essential hypertension 04/09/2013  . Hyperlipidemia 04/09/2013  . Tobacco abuse 04/09/2013  . Carotid artery disease ([place]) 04/09/2013    Past Surgical History:  Procedure Laterality Date  . DOPPLER ECHOCARDIOGRAPHY    . Duplex ultrasound    . HERNIA REPAIR     left and right  . NM MYOVIEW LTD  03/2010   Inferior septal ischemia  . stress myocardial perfusion    . TONSILLECTOMY AND ADENOIDECTOMY         Home Medications    Prior to Admission medications   Medication Sig Start Date End Date Taking? Authorizing Provider  albuterol (PROVENTIL HFA;VENTOLIN HFA) 108 (90 Base) MCG/ACT inhaler Inhale 2 puffs into the lungs every 6 (six) hours as needed for wheezing or shortness of breath. 11/02/15  Yes [name], MD  aspirin 81 MG tablet Take 81 mg by mouth daily.   Yes Historical Provider, MD  atorvastatin (LIPITOR) 40 MG tablet TAKE 1 TABLET (40 MG TOTAL) BY MOUTH DAILY. 09/05/15  Yes [name], MD  Cholecalciferol (VITAMIN D) 2000 UNITS CAPS Take 2,000 Units by mouth daily.   Yes Historical Provider, MD  clopidogrel (PLAVIX) 75 MG tablet Take 1 tablet (75 mg total) by mouth daily with breakfast. 05/28/16  Yes Lorretta Harp, MD  Coenzyme Q10 (CO Q10) 100 MG CAPS Take 200 mg by mouth daily.   Yes Historical Provider, MD  glucosamine-chondroitin 500-400 MG tablet Take 1 tablet by mouth 2 (two) times daily.   Yes Historical Provider, MD  KRILL OIL PO Take 2 capsules by mouth daily.   Yes Historical Provider, MD  Multiple Vitamin (MULTIVITAMIN) capsule Take 1 capsule by mouth daily.   Yes Historical Provider, MD  umeclidinium-vilanterol (ANORO ELLIPTA) 62.5-25 MCG/INH AEPB Inhale 1 puff into the lungs daily. 11/02/15  Yes [name], MD  doxycycline (VIBRAMYCIN) 100 MG capsule Take 1 capsule (100 mg total) by mouth 2 (two) times daily. 06/13/16   Dalia Heading, PA-C  Guaifenesin 1200 MG TB12 Take 1 tablet (1,200 mg total) by mouth 2 (two) times daily. 06/13/16   Dalia Heading, PA-C  predniSONE (DELTASONE) 50 MG tablet Take 1 tablet (50 mg total) by mouth daily. 06/13/16   Dalia Heading, PA-C  promethazine-dextromethorphan (PROMETHAZINE-DM) 6.25-15 MG/5ML syrup Take 5 mLs by mouth 4 (four) times daily as needed for cough. 06/13/16   Dalia Heading, PA-C    Family History History reviewed. No pertinent family history.  Social History Social History  Substance Use Topics  . Smoking status: Current Every Day Smoker    Types: Cigarettes  . Smokeless tobacco: Never Used     Comment: weaning off cigs and using the e cig  . Alcohol use 0.6 oz/week    1 Cans of beer per week     Allergies   Penicillins   Review of Systems Review of Systems All other systems  negative except as documented in the HPI. All pertinent positives and negatives as reviewed in the HPI.  Physical Exam Updated Vital Signs BP (!) 133/50   Pulse 112   Temp 98 F (36.7 C) (Oral)   Resp 26   Ht 6\' 2"  (1.88 m)   Wt 81.6 kg   SpO2 96%   BMI 23.11 kg/m   Physical Exam  Constitutional: He is oriented to person, place, and time. He appears well-developed and well-nourished. No distress.  HENT:  Head: Normocephalic and atraumatic.  Mouth/Throat: Oropharynx is clear and moist.  Eyes: Pupils are equal, round, and reactive to light.  Neck: Normal range of motion. Neck supple.  Cardiovascular: Normal rate, regular rhythm and normal heart sounds.  Exam reveals no gallop and no friction rub.   No murmur heard. Pulmonary/Chest: Effort normal. No respiratory distress. He has wheezes. He has no rales.  Abdominal: Soft. Bowel sounds are normal. He exhibits no distension. There is no tenderness.  Neurological: He is alert and oriented to person, place, and time. He exhibits normal muscle tone. Coordination normal.  Skin: Skin is warm and dry. No rash noted. No erythema.  Psychiatric: He has a normal mood and affect. His behavior is normal.  Nursing note and vitals reviewed.    ED Treatments / Results  Labs (all labs ordered are listed, but only abnormal results are displayed) Labs Reviewed  BASIC METABOLIC PANEL - Abnormal; Notable for the following:       Result Value   Glucose, Bld 133 (*)    All other components within normal limits  CBC WITH DIFFERENTIAL/PLATELET - Abnormal; Notable for the following:    WBC 12.5 (*)    Neutro Abs 9.4 (*)    All other components within normal limits    EKG  EKG Interpretation  Date/Time:  Wednesday June 13 2016 09:56:48 EST Ventricular Rate:  112 PR Interval:    QRS Duration: 89 QT Interval:  328 QTC Calculation: 448 R Axis:   168 Text Interpretation:  Sinus tachycardia Right axis deviation No old tracing to compare  Abnormal ekg Confirmed by Sabra Heck  MD, BRIAN ([id number]) on 06/13/2016 11:13:30 AM       Radiology Dg Chest 2 View  Result Date: 06/13/2016 CLINICAL DATA:  Cough, congestion and shortness breath EXAM: CHEST  2 VIEW COMPARISON:  Chest radiograph 07/13/2015 FINDINGS: The lungs are hyperinflated with diffusely increased pulmonary markings. No focal consolidation or pulmonary edema. Normal cardiomediastinal contours. No pleural effusion or pneumothorax. IMPRESSION: 1. COPD. 2. No acute airspace disease. Electronically Signed   By: Ulyses Jarred M.D.   On: 06/13/2016 04:40    Procedures Procedures (including critical care time)  Medications Ordered in ED Medications  albuterol (PROVENTIL,VENTOLIN) solution continuous neb (0 mg/hr Nebulization Stopped 06/13/16 0855)  albuterol (PROVENTIL) (2.5 MG/3ML) 0.083% nebulizer solution 5 mg (5  mg Nebulization Given 06/13/16 0559)  ipratropium (ATROVENT) nebulizer solution 0.5 mg (0.5 mg Nebulization Given 06/13/16 0720)  methylPREDNISolone sodium succinate (SOLU-MEDROL) 125 mg/2 mL injection 125 mg (125 mg Intravenous Given 06/13/16 0906)     Initial Impression / Assessment and Plan / ED Course  I have reviewed the triage vital signs and the nursing notes.  Pertinent labs & imaging results that were available during my care of the patient were reviewed by me and considered in my medical decision making (see chart for details).     Patient is observed over several hours.  He is improved and his wheezing has gotten better.  Patient was ambulated and did not have any difficulty doing so.  He ambulated and his pulse ox stayed at 94% and above .  The patient would like to be discharged home Final Clinical Impressions(s) / ED Diagnoses   Final diagnoses:  Acute upper respiratory infection  Chronic obstructive pulmonary disease, unspecified COPD type ([place])    New Prescriptions Discharge Medication List as of 06/13/2016 10:29 AM    START taking these  medications   Details  doxycycline (VIBRAMYCIN) 100 MG capsule Take 1 capsule (100 mg total) by mouth 2 (two) times daily., Starting Wed 06/13/2016, Print    Guaifenesin 1200 MG TB12 Take 1 tablet (1,200 mg total) by mouth 2 (two) times daily., Starting Wed 06/13/2016, Print    predniSONE (DELTASONE) 50 MG tablet Take 1 tablet (50 mg total) by mouth daily., Starting Wed 06/13/2016, Print    promethazine-dextromethorphan (PROMETHAZINE-DM) 6.25-15 MG/5ML syrup Take 5 mLs by mouth 4 (four) times daily as needed for cough., Starting Wed 06/13/2016, Print         [place], PA-C 06/16/16 [place], MD 06/21/16 [phone]

## 2016-06-13 NOTE — ED Triage Notes (Signed)
C/o coughing on Monday states he had a lot of post nasal drip and was very congested. States his chest is feeling more congested.

## 2016-06-13 NOTE — ED Notes (Addendum)
Pt up at bedside, seeming somewhat anxious. Repeatedly blowing his nose. Pt states he "needs to be more upright." Reports anytime he leans his head back he can't breathe. Placed pt on 2L O2 via [place] in effort to reduce dypnea. Will speak with [place], MD about neb tx.

## 2016-06-13 NOTE — ED Notes (Signed)
Walked patient down the hall on room air pulse oxy patient stayed at 94 room air went down to 91 heart rate stayed about at 100 patient stated that he was doing great

## 2016-06-18 ENCOUNTER — Telehealth: Payer: Self-pay | Admitting: Family Medicine

## 2016-06-18 DIAGNOSIS — J449 Chronic obstructive pulmonary disease, unspecified: Secondary | ICD-10-CM

## 2016-06-18 NOTE — Telephone Encounter (Signed)
OK to place referral or do you need to see him 1st?

## 2016-06-18 NOTE — Telephone Encounter (Signed)
Ok with referral. 

## 2016-06-18 NOTE — Telephone Encounter (Signed)
Patient is calling to see if he can get an appt with a pulmonary specialist for her copd  Would like this asap if possible

## 2016-06-18 NOTE — Telephone Encounter (Signed)
Referral placed.

## 2016-06-21 ENCOUNTER — Ambulatory Visit (INDEPENDENT_AMBULATORY_CARE_PROVIDER_SITE_OTHER): Payer: Medicare Other | Admitting: Internal Medicine

## 2016-06-21 ENCOUNTER — Encounter: Payer: Self-pay | Admitting: Internal Medicine

## 2016-06-21 VITALS — BP 142/90 | HR 84 | Ht 74.0 in | Wt 186.0 lb

## 2016-06-21 DIAGNOSIS — J449 Chronic obstructive pulmonary disease, unspecified: Secondary | ICD-10-CM

## 2016-06-21 MED ORDER — FAMOTIDINE 20 MG PO TABS: ORAL_TABLET | ORAL | Status: DC

## 2016-06-21 MED ORDER — BUDESONIDE-FORMOTEROL FUMARATE 160-4.5 MCG/ACT IN AERO: 2.0000 | INHALATION_SPRAY | Freq: Two times a day (BID) | RESPIRATORY_TRACT | 0 refills | Status: DC

## 2016-06-21 MED ORDER — BUDESONIDE-FORMOTEROL FUMARATE 160-4.5 MCG/ACT IN AERO: INHALATION_SPRAY | RESPIRATORY_TRACT | 11 refills | Status: DC

## 2016-06-21 MED ORDER — PANTOPRAZOLE SODIUM 40 MG PO TBEC: 40.0000 mg | DELAYED_RELEASE_TABLET | Freq: Every day | ORAL | 2 refills | Status: DC

## 2016-06-21 NOTE — Progress Notes (Signed)
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 Pulmonary Consult    Dr. Dennard Schaumann referred him here, he was dx'd with COPD back in the fall, been taking Anoro and Pro Air, a cold caused him constant drainage with coughing and sneezing with SOB, he end up in the hospital, stopped smoking 2 weeks ago  about 50 % better since in ER but still with sense of throat and chest congestion more day than noct assoc with doe = MMRC2 = can't walk a nl pace on a flat grade s sob but does fine slow and flat eg shopping ok  No obvious day to day or daytime variability or assoc excess/ purulent sputum or mucus plugs or hemoptysis or cp or chest tightness, subjective wheeze or overt sinus or hb symptoms. No unusual exp hx or h/o childhood pna/ asthma or knowledge of premature birth.  Sleeping ok without nocturnal  or early am exacerbation  of respiratory  c/o's or need for noct saba. Also denies any obvious fluctuation of symptoms with weather or environmental changes or other aggravating or alleviating factors except as outlined above   Current Medications, Allergies, Complete Past Medical History, Past Surgical History, Family History, and Social History were reviewed in [employer] record.      Review of Systems  Constitutional: Negative for fever and unexpected weight change.  HENT: Positive for dental problem and sneezing. Negative for congestion, ear pain, nosebleeds,  postnasal drip, rhinorrhea, sinus pressure, sore throat and trouble swallowing.   Eyes: Negative for redness and itching.  Respiratory: Positive for cough and shortness of breath. Negative for chest tightness and wheezing.   Cardiovascular: Negative for palpitations and leg swelling.  Gastrointestinal: Negative for nausea and vomiting.  Genitourinary: Negative for dysuria.  Musculoskeletal: Negative for joint swelling.  Skin: Negative for rash.  Neurological: Negative for headaches.  Hematological: Does not bruise/bleed easily.  Psychiatric/Behavioral: Negative for dysphoric mood. The patient is not nervous/anxious.        Objective:   Physical Exam  amb wm nad   Wt Readings from Last 3 Encounters:  06/21/16 186 lb (84.4 kg)  06/13/16 180 lb (81.6 kg)  09/08/15 188 lb (85.3 kg)    Vital signs reviewed  - Note on arrival 02 sats  94% on RA     HEENT: nl dentition, turbinates bilaterally, and oropharynx. Nl external ear canals without cough reflex   NECK :  without JVD/Nodes/TM/ nl carotid upstrokes bilaterally   LUNGS: no acc muscle use,  slt barrel contour chest with mid exp wheeze bilaterally    CV:  RRR  no s3 or murmur or increase in P2, and no edema   ABD:  soft and nontender with nl inspiratory excursion in the supine position. No bruits or organomegaly appreciated, bowel sounds nl  MS:  Nl gait/ ext warm without deformities,  calf tenderness, cyanosis -  Moderate bilateral clubbing No obvious joint restrictions   SKIN: warm and dry without lesions    NEURO:  alert, approp, nl sensorium with  no motor or cerebellar deficits apparent.     I personally reviewed images and agree with radiology impression as follows:  CXR:   06/13/16 1. COPD. 2. No acute airspace disease.       Assessment & Plan:

## 2016-06-21 NOTE — Patient Instructions (Addendum)
Plan A = Automatic = Symbicort 160 Take 2 puffs first thing in am and then another 2 puffs about 12 hours later.   Plan B = Backup Only use your albuterol as a rescue medication to be used if you can't catch your breath by resting or doing a relaxed purse lip breathing pattern.  - The less you use it, the better it will work when you need it. - Ok to use the inhaler up to 2 puffs  every 4 hours if you must but call for appointment if use goes up over your usual need - Don't leave home without it !!  (think of it like the spare tire for your car)     Pantoprazole (protonix) 40 mg   Take  30-60 min before first meal of the day and Pepcid (famotidine)  20 mg one @  bedtime until return to office - this is the best way to tell whether stomach acid is contributing to your problem.    GERD (REFLUX)  is an extremely common cause of respiratory symptoms just like yours , many times with no obvious heartburn at all.    It can be treated with medication, but also with lifestyle changes including elevation of the head of your bed (ideally with 6 inch  bed blocks),  Smoking cessation, avoidance of late meals, excessive alcohol, and avoid fatty foods, chocolate, peppermint, colas, red wine, and acidic juices such as orange juice.  NO MINT OR MENTHOL PRODUCTS SO NO COUGH DROPS   USE SUGARLESS CANDY INSTEAD (Jolley ranchers or Stover's or Life Savers) or even ice chips will also do - the key is to swallow to prevent all throat clearing. NO OIL BASED VITAMINS - use powdered substitutes.    Please schedule a follow up office visit in 4 weeks, sooner if needed with pfts

## 2016-06-22 NOTE — Assessment & Plan Note (Signed)
Quit smoking 05/2016 - Spirometry 06/21/2016  FEV1 1.95 (52%)  Ratio 66 p anoro and saba   -  06/21/2016  After extensive coaching HFA effectiveness =    75% try symbicort 160 2bid   DDX of  difficult airways management almost all start with A and  include Adherence, Ace Inhibitors, Acid Reflux, Active Sinus Disease, Alpha 1 Antitripsin deficiency, Anxiety masquerading as Airways dz,  ABPA,  Allergy(esp in young), Aspiration (esp in elderly), Adverse effects of meds,  Active smokers, A bunch of PE's (a small clot burden can't cause this syndrome unless there is already severe underlying pulm or vascular dz with poor reserve) plus two Bs  = Bronchiectasis and Beta blocker use..and one C= CHF  Adherence is always the initial "prime suspect" and is a multilayered concern that requires a "trust but verify" approach in every patient - starting with knowing how to use medications, especially inhalers, correctly, keeping up with refills and understanding the fundamental difference between maintenance and prns vs those medications only taken for a very short course and then stopped and not refilled.  - see mdi teaching  - discussed formulary issues  ? Acid (or non-acid) GERD > always difficult to exclude as up to 75% of pts in some series report no assoc GI/ Heartburn symptoms and the abrupt onset of symptoms while supine in 06/2016 is very suggestive of GERD/LPR > rec max (24h)  acid suppression and diet  restrictions/ reviewed and instructions given in writing.   ? Active smoking >  Denies I reviewed the Fletcher curve with the patient that basically indicates  if you quit smoking when your best day FEV1 is still well preserved (as is relatively still   the case here)  it is highly unlikely you will progress to severe disease and informed the patient there was  no medication on the market that has proven to alter the curve/ its downward trajectory  or the likelihood of progression of their disease(unlike other  chronic medical conditions such as atheroclerosis where we do think we can change the natural hx with risk reducing meds)    Therefore  maintaining abstinence at this point is the most important aspect of care, not choice of inhalers or for that matter, doctors.    ? Allergy/ asthma > Symb 160 2bid should cover this/ advised to blow out thru nose  ? Adverse effects of DPI > try hfa   ? chf > nothing on cxr or exam to suggest   Will see back in 4 weeks for full pfts on symbicort  160 then ? Add lama next depending on symptoms and gold restaging  Total time devoted to counseling  > 50 % of initial 60 min office visit:  review case with pt/ discussion of options/alternatives/ personally creating written customized instructions  in presence of pt  then going over those specific  Instructions directly with the pt including how to use all of the meds but in particular covering each new medication in detail and the difference between the maintenance= "automatic" meds and the prns using an action plan format for the latter (If this problem/symptom => do that organization reading Left to right).  Please see AVS from this visit for a full list of these instructions which I personally wrote for this pt and  are unique to this visit.

## 2016-06-25 ENCOUNTER — Telehealth: Payer: Self-pay | Admitting: Internal Medicine

## 2016-06-25 MED ORDER — BUDESONIDE-FORMOTEROL FUMARATE 160-4.5 MCG/ACT IN AERO: INHALATION_SPRAY | RESPIRATORY_TRACT | 11 refills | Status: DC

## 2016-06-25 NOTE — Telephone Encounter (Signed)
Spoke with pt. He is needing a prescription sent in for Symbicort. Per his chart, this prescription was printed. Pt states that he didn't get this while he was in the office. He requests that we send this to his pharmacy. This has been taken care of. Nothing further was needed.

## 2016-07-19 ENCOUNTER — Ambulatory Visit (INDEPENDENT_AMBULATORY_CARE_PROVIDER_SITE_OTHER): Payer: Medicare Other | Admitting: Internal Medicine

## 2016-07-19 ENCOUNTER — Ambulatory Visit (HOSPITAL_COMMUNITY)
Admission: RE | Admit: 2016-07-19 | Discharge: 2016-07-19 | Disposition: A | Discharge status: Home / Self Care | Payer: Medicare Other | Source: Ambulatory Visit | Attending: Internal Medicine | Admitting: Internal Medicine

## 2016-07-19 ENCOUNTER — Encounter: Payer: Self-pay | Admitting: Internal Medicine

## 2016-07-19 VITALS — BP 128/80 | HR 84 | Ht 74.0 in | Wt 189.6 lb

## 2016-07-19 DIAGNOSIS — R05 Cough: Secondary | ICD-10-CM | POA: Diagnosis not present

## 2016-07-19 DIAGNOSIS — F17210 Nicotine dependence, cigarettes, uncomplicated: Secondary | ICD-10-CM | POA: Diagnosis not present

## 2016-07-19 DIAGNOSIS — J449 Chronic obstructive pulmonary disease, unspecified: Secondary | ICD-10-CM

## 2016-07-19 DIAGNOSIS — R058 Other specified cough: Secondary | ICD-10-CM | POA: Insufficient documentation

## 2016-07-19 LAB — PULMONARY FUNCTION TEST
DL/VA % PRED: 65 %
DL/VA: 3.14 ml/min/mmHg/L
DLCO UNC: 16.42 ml/min/mmHg
DLCO unc % pred: 43 %
FEF 25-75 POST: 1.18 L/s
FEF 25-75 PRE: 1.05 L/s
FEF2575-%CHANGE-POST: 11 %
FEF2575-%PRED-POST: 42 %
FEF2575-%PRED-PRE: 37 %
FEV1-%Change-Post: 2 %
FEV1-%PRED-POST: 58 %
FEV1-%PRED-PRE: 56 %
FEV1-POST: 2.16 L
FEV1-Pre: 2.11 L
FEV1FVC-%CHANGE-POST: 4 %
FEV1FVC-%PRED-PRE: 85 %
FEV6-%CHANGE-POST: -1 %
FEV6-%PRED-PRE: 68 %
FEV6-%Pred-Post: 67 %
FEV6-Post: 3.25 L
FEV6-Pre: 3.28 L
FEV6FVC-%Change-Post: 0 %
FEV6FVC-%Pred-Post: 102 %
FEV6FVC-%Pred-Pre: 102 %
FVC-%Change-Post: -1 %
FVC-%PRED-PRE: 66 %
FVC-%Pred-Post: 65 %
FVC-POST: 3.33 L
FVC-PRE: 3.38 L
POST FEV1/FVC RATIO: 65 %
POST FEV6/FVC RATIO: 97 %
PRE FEV1/FVC RATIO: 62 %
Pre FEV6/FVC Ratio: 97 %
RV % PRED: 143 %
RV: 3.88 L
TLC % PRED: 95 %
TLC: 7.47 L

## 2016-07-19 MED ORDER — ALBUTEROL SULFATE (2.5 MG/3ML) 0.083% IN NEBU
2.5000 mg | INHALATION_SOLUTION | Freq: Once | RESPIRATORY_TRACT | Status: AC
Start: 2016-07-19 — End: 2016-07-19
  Administered 2016-07-19: 2.5 mg via RESPIRATORY_TRACT

## 2016-07-19 MED ORDER — BUDESONIDE-FORMOTEROL FUMARATE 160-4.5 MCG/ACT IN AERO: INHALATION_SPRAY | RESPIRATORY_TRACT | 0 refills | Status: DC

## 2016-07-19 NOTE — Progress Notes (Signed)
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 Pulmonary Consult    Dr. Dennard Schaumann referred him here, he was dx'd with COPD back in the fall, been taking Anoro and Pro Air, a cold caused him constant drainage with coughing and sneezing with SOB, he end up in the hospital, stopped smoking 2 weeks ago  about 50 % better since in ER but still with sense of throat and chest congestion more day than noct assoc with doe = MMRC2 = can't walk a nl pace on a flat grade s sob but does fine slow and flat eg shopping ok rec Plan A = Automatic = Symbicort 160 Take 2 puffs first thing in am and then another 2 puffs about 12 hours later.  Plan B = Backup Only use your albuterol as a rescue medication  Pantoprazole (protonix) 40 mg   Take  30-60 min before first meal of the day and Pepcid (famotidine)  20 mg one @  bedtime  GERD  Please schedule a follow up office visit in 4 weeks, sooner if needed with pfts     07/19/2016  f/u ov/[name] re: copd II on symb 160 2 bid / gerd dr  Chief Complaint  Patient presents with  . Follow-up    He has been coughing less but having chest tightness. He uses his albuterol inhaler 2-3 x per day. Started smoking again daily.   overall better despite smoking and reported chest tightness that resolves immediately with albuterol  - cough is assoc with nasal congestion and sense of pnds   No obvious day to day or daytime  variability or assoc excess/ purulent sputum or mucus plugs or hemoptysis or cp or chest tightness, subjective wheeze or overt  r hb symptoms. No unusual exp hx or h/o childhood pna/ asthma or knowledge of premature birth.  Sleeping ok without nocturnal  or early am exacerbation  of respiratory  c/o's or need for noct saba. Also denies any obvious fluctuation of symptoms with weather or environmental changes or other aggravating or alleviating factors except as outlined above   Current Medications, Allergies, Complete Past Medical History, Past Surgical History, Family History, and Social History were reviewed in [employer] record.  ROS  The following are not active complaints unless bolded sore throat, dysphagia, dental problems, itching, sneezing,  nasal congestion or excess/ purulent secretions, ear ache,   fever, chills, sweats, unintended wt loss, classically pleuritic or exertional cp,  orthopnea pnd or leg swelling, presyncope, palpitations, abdominal pain, anorexia, nausea, vomiting, diarrhea  or change in bowel or bladder habits, change in stools or urine, dysuria,hematuria,  rash, arthralgias, visual complaints, headache, numbness, weakness or ataxia or problems with walking or coordination,  change in mood/affect or memory.  Objective:   Physical Exam  amb wm nad    07/19/2016       189   06/21/16 186 lb (84.4 kg)  06/13/16 180 lb (81.6 kg)  09/08/15 188 lb (85.3 kg)    Vital signs reviewed  - Note on arrival 02 sats  95% on RA     HEENT: nl dentition,  , and oropharynx. Nl external ear canals without cough reflex - moderate bilateral non-specific turbinate edema    NECK :  without JVD/Nodes/TM/ nl carotid upstrokes bilaterally   LUNGS: no acc muscle use,   Coarse insp and exp rhonchi bilaterally    CV:  RRR  no s3 or murmur or increase in P2, and no edema   ABD:  soft and nontender with nl inspiratory excursion in the supine  position. No bruits or organomegaly appreciated, bowel sounds nl  MS:  Nl gait/ ext warm without deformities, calf tenderness, cyanosis -  Moderate bilateral clubbing No obvious joint restrictions   SKIN: warm and dry without lesions    NEURO:  alert, approp, nl sensorium with  no motor or cerebellar deficits apparent.     I personally reviewed images and agree with radiology impression as follows:  CXR:   06/13/16 1. COPD. 2. No acute airspace disease.       Assessment & Plan:

## 2016-07-19 NOTE — Patient Instructions (Addendum)
Please see patient coordinator before you leave today to schedule sinus CT   No change in medications  The key is to stop smoking completely before smoking completely stops you - it's not too late as you only have mild copd   Please schedule a follow up visit in 3 months but call sooner if needed  with all medications /inhalers/ solutions in hand so we can verify exactly what you are taking. This includes all medications from all doctors and over the counters

## 2016-07-20 ENCOUNTER — Ambulatory Visit (INDEPENDENT_AMBULATORY_CARE_PROVIDER_SITE_OTHER)
Admission: RE | Admit: 2016-07-20 | Discharge: 2016-07-20 | Disposition: A | Discharge status: Home / Self Care | Payer: Medicare Other | Source: Ambulatory Visit | Attending: Internal Medicine | Admitting: Internal Medicine

## 2016-07-20 DIAGNOSIS — J3489 Other specified disorders of nose and nasal sinuses: Secondary | ICD-10-CM | POA: Diagnosis not present

## 2016-07-20 DIAGNOSIS — R05 Cough: Secondary | ICD-10-CM | POA: Diagnosis not present

## 2016-07-20 DIAGNOSIS — R058 Other specified cough: Secondary | ICD-10-CM

## 2016-07-20 NOTE — Assessment & Plan Note (Signed)
Needs sinus CT to complete the w/u > ordered

## 2016-07-20 NOTE — Assessment & Plan Note (Signed)
Quit smoking 05/2016 - Spirometry 06/21/2016  FEV1 1.95 (52%)  Ratio 66 p anoro and saba   -  06/21/2016  After extensive coaching HFA effectiveness =    75% try symbicort 160 2bid  - PFT's  07/19/2016  FEV1 2.16 (58 % ) ratio 65  p 2 % improvement from saba p symb 160 prior to study with DLCO  43 % corrects to 65  % for alv volume    - The proper method of use, as well as anticipated side effects, of a metered-dose inhaler are discussed and demonstrated to the patient. Improved effectiveness after extensive coaching during this visit to a level of approximately 90 % from a baseline of 75 % > continue hfa  He is group C COPD by symptom and GOLD II criteria by pfts so best choice is smb 160 2bid and work on smoking cessation   I had an extended discussion with the patient reviewing all relevant studies completed to date and  lasting 15 to 20 minutes of a 25 minute visit    Each maintenance medication was reviewed in detail including most importantly the difference between maintenance and prns and under what circumstances the prns are to be triggered using an action plan format that is not reflected in the computer generated alphabetically organized AVS.    Please see AVS for specific instructions unique to this visit that I personally wrote and verbalized to the the pt in detail and then reviewed with pt  by my nurse highlighting any  changes in therapy recommended at today's visit to their plan of care.

## 2016-07-20 NOTE — Assessment & Plan Note (Signed)
>   3 min discussion I reviewed the Fletcher curve with the patient that basically indicates  if you quit smoking when your best day FEV1 is still well preserved (as is relatively still   the case here)  it is highly unlikely you will progress to severe disease and informed the patient there was  no medication on the market that has proven to alter the curve/ its downward trajectory  or the likelihood of progression of their disease(unlike other chronic medical conditions such as atheroclerosis where we do think we can change the natural hx with risk reducing meds)    Therefore stopping smoking and maintaining abstinence is the most important aspect of care, not choice of inhalers or for that matter, doctors.   

## 2016-08-21 DIAGNOSIS — J189 Pneumonia, unspecified organism: Secondary | ICD-10-CM

## 2016-08-21 HISTORY — DX: Pneumonia, unspecified organism: J18.9

## 2016-08-28 ENCOUNTER — Emergency Department (HOSPITAL_COMMUNITY): Payer: Medicare Other

## 2016-08-28 ENCOUNTER — Ambulatory Visit: Payer: Medicare Other | Admitting: Family Medicine

## 2016-08-28 ENCOUNTER — Encounter (HOSPITAL_COMMUNITY): Payer: Self-pay | Admitting: Emergency Medicine

## 2016-08-28 ENCOUNTER — Observation Stay (HOSPITAL_COMMUNITY)
Admission: EM | Admit: 2016-08-28 | Discharge: 2016-08-29 | Disposition: A | Discharge status: Home / Self Care | Payer: Medicare Other | Attending: Family Medicine | Admitting: Family Medicine

## 2016-08-28 DIAGNOSIS — I739 Peripheral vascular disease, unspecified: Secondary | ICD-10-CM | POA: Diagnosis not present

## 2016-08-28 DIAGNOSIS — J918 Pleural effusion in other conditions classified elsewhere: Secondary | ICD-10-CM | POA: Insufficient documentation

## 2016-08-28 DIAGNOSIS — R740 Nonspecific elevation of levels of transaminase and lactic acid dehydrogenase [LDH]: Secondary | ICD-10-CM | POA: Diagnosis not present

## 2016-08-28 DIAGNOSIS — Z880 Allergy status to penicillin: Secondary | ICD-10-CM | POA: Insufficient documentation

## 2016-08-28 DIAGNOSIS — J189 Pneumonia, unspecified organism: Secondary | ICD-10-CM | POA: Diagnosis not present

## 2016-08-28 DIAGNOSIS — J440 Chronic obstructive pulmonary disease with acute lower respiratory infection: Secondary | ICD-10-CM | POA: Diagnosis not present

## 2016-08-28 DIAGNOSIS — J9601 Acute respiratory failure with hypoxia: Secondary | ICD-10-CM | POA: Diagnosis not present

## 2016-08-28 DIAGNOSIS — J181 Lobar pneumonia, unspecified organism: Secondary | ICD-10-CM | POA: Diagnosis not present

## 2016-08-28 DIAGNOSIS — R109 Unspecified abdominal pain: Secondary | ICD-10-CM | POA: Diagnosis not present

## 2016-08-28 DIAGNOSIS — Z7982 Long term (current) use of aspirin: Secondary | ICD-10-CM | POA: Insufficient documentation

## 2016-08-28 DIAGNOSIS — I10 Essential (primary) hypertension: Secondary | ICD-10-CM | POA: Insufficient documentation

## 2016-08-28 DIAGNOSIS — E7800 Pure hypercholesterolemia, unspecified: Secondary | ICD-10-CM | POA: Insufficient documentation

## 2016-08-28 DIAGNOSIS — E784 Other hyperlipidemia: Secondary | ICD-10-CM | POA: Diagnosis not present

## 2016-08-28 DIAGNOSIS — I700 Atherosclerosis of aorta: Secondary | ICD-10-CM | POA: Insufficient documentation

## 2016-08-28 DIAGNOSIS — Z79899 Other long term (current) drug therapy: Secondary | ICD-10-CM | POA: Insufficient documentation

## 2016-08-28 DIAGNOSIS — J449 Chronic obstructive pulmonary disease, unspecified: Secondary | ICD-10-CM | POA: Diagnosis present

## 2016-08-28 DIAGNOSIS — K5730 Diverticulosis of large intestine without perforation or abscess without bleeding: Secondary | ICD-10-CM | POA: Insufficient documentation

## 2016-08-28 DIAGNOSIS — R0602 Shortness of breath: Secondary | ICD-10-CM | POA: Diagnosis not present

## 2016-08-28 DIAGNOSIS — R079 Chest pain, unspecified: Secondary | ICD-10-CM | POA: Diagnosis not present

## 2016-08-28 DIAGNOSIS — R0902 Hypoxemia: Secondary | ICD-10-CM | POA: Diagnosis not present

## 2016-08-28 DIAGNOSIS — R7401 Elevation of levels of liver transaminase levels: Secondary | ICD-10-CM | POA: Diagnosis present

## 2016-08-28 DIAGNOSIS — R05 Cough: Secondary | ICD-10-CM | POA: Diagnosis not present

## 2016-08-28 DIAGNOSIS — E785 Hyperlipidemia, unspecified: Secondary | ICD-10-CM | POA: Insufficient documentation

## 2016-08-28 DIAGNOSIS — I6521 Occlusion and stenosis of right carotid artery: Secondary | ICD-10-CM | POA: Insufficient documentation

## 2016-08-28 DIAGNOSIS — F17210 Nicotine dependence, cigarettes, uncomplicated: Secondary | ICD-10-CM | POA: Diagnosis not present

## 2016-08-28 DIAGNOSIS — I2510 Atherosclerotic heart disease of native coronary artery without angina pectoris: Secondary | ICD-10-CM | POA: Diagnosis present

## 2016-08-28 LAB — PROCALCITONIN: PROCALCITONIN: 0.76 ng/mL

## 2016-08-28 LAB — CBC WITH DIFFERENTIAL/PLATELET
BASOS ABS: 0 10*3/uL (ref 0.0–0.1)
BASOS PCT: 0 %
Eosinophils Absolute: 0.3 10*3/uL (ref 0.0–0.7)
Eosinophils Relative: 2 %
HEMATOCRIT: 40.8 % (ref 39.0–52.0)
Hemoglobin: 14.1 g/dL (ref 13.0–17.0)
Lymphocytes Relative: 5 %
Lymphs Abs: 0.7 10*3/uL (ref 0.7–4.0)
MCH: 32.5 pg (ref 26.0–34.0)
MCHC: 34.6 g/dL (ref 30.0–36.0)
MCV: 94 fL (ref 78.0–100.0)
MONOS PCT: 6 %
Monocytes Absolute: 0.7 10*3/uL (ref 0.1–1.0)
NEUTROS ABS: 11.8 10*3/uL — AB (ref 1.7–7.7)
NEUTROS PCT: 87 %
Platelets: 283 10*3/uL (ref 150–400)
RBC: 4.34 MIL/uL (ref 4.22–5.81)
RDW: 13.1 % (ref 11.5–15.5)
WBC: 13.6 10*3/uL — ABNORMAL HIGH (ref 4.0–10.5)

## 2016-08-28 LAB — CK: Total CK: 80 U/L (ref 49–397)

## 2016-08-28 LAB — COMPREHENSIVE METABOLIC PANEL
ALBUMIN: 3 g/dL — AB (ref 3.5–5.0)
ALK PHOS: 66 U/L (ref 38–126)
ALT: 66 U/L — ABNORMAL HIGH (ref 17–63)
ANION GAP: 9 (ref 5–15)
AST: 56 U/L — AB (ref 15–41)
BILIRUBIN TOTAL: 0.6 mg/dL (ref 0.3–1.2)
BUN: 14 mg/dL (ref 6–20)
CALCIUM: 9 mg/dL (ref 8.9–10.3)
CO2: 23 mmol/L (ref 22–32)
Chloride: 105 mmol/L (ref 101–111)
Creatinine, Ser: 1.11 mg/dL (ref 0.61–1.24)
GFR calc Af Amer: >60 mL/min (ref >60)
GFR calc non Af Amer: >60 mL/min (ref >60)
GLUCOSE: 166 mg/dL — AB (ref 65–99)
Potassium: 3.9 mmol/L (ref 3.5–5.1)
SODIUM: 137 mmol/L (ref 135–145)
Total Protein: 7.1 g/dL (ref 6.5–8.1)

## 2016-08-28 LAB — BRAIN NATRIURETIC PEPTIDE: B NATRIURETIC PEPTIDE 5: 75.1 pg/mL (ref 0.0–100.0)

## 2016-08-28 LAB — INFLUENZA PANEL BY PCR (TYPE A & B)
INFLAPCR: NEGATIVE
Influenza B By PCR: NEGATIVE

## 2016-08-28 LAB — I-STAT CG4 LACTIC ACID, ED: LACTIC ACID, VENOUS: 0.78 mmol/L (ref 0.5–1.9)

## 2016-08-28 LAB — LIPASE, BLOOD: Lipase: 18 U/L (ref 11–51)

## 2016-08-28 MED ORDER — LEVOFLOXACIN IN D5W 750 MG/150ML IV SOLN
750.0000 mg | Freq: Once | INTRAVENOUS | Status: AC
  Administered 2016-08-28: 750 mg via INTRAVENOUS
  Filled 2016-08-28: qty 150

## 2016-08-28 MED ORDER — SODIUM CHLORIDE 0.9 % IV SOLN
INTRAVENOUS | Status: DC
  Administered 2016-08-28 – 2016-08-29 (×2): via INTRAVENOUS

## 2016-08-28 MED ORDER — ALBUTEROL SULFATE (2.5 MG/3ML) 0.083% IN NEBU
2.5000 mg | INHALATION_SOLUTION | Freq: Four times a day (QID) | RESPIRATORY_TRACT | Status: DC | PRN
  Administered 2016-08-29: 2.5 mg via RESPIRATORY_TRACT
  Filled 2016-08-28: qty 3

## 2016-08-28 MED ORDER — ONDANSETRON HCL 4 MG PO TABS: 4.0000 mg | ORAL_TABLET | Freq: Four times a day (QID) | ORAL | Status: DC | PRN

## 2016-08-28 MED ORDER — ENOXAPARIN SODIUM 40 MG/0.4ML ~~LOC~~ SOLN
40.0000 mg | SUBCUTANEOUS | Status: DC
  Administered 2016-08-28: 40 mg via SUBCUTANEOUS
  Filled 2016-08-28: qty 0.4

## 2016-08-28 MED ORDER — LEVOFLOXACIN IN D5W 750 MG/150ML IV SOLN
750.0000 mg | INTRAVENOUS | Status: DC
  Filled 2016-08-28: qty 150

## 2016-08-28 MED ORDER — CLOPIDOGREL BISULFATE 75 MG PO TABS
75.0000 mg | ORAL_TABLET | Freq: Every day | ORAL | Status: DC
  Administered 2016-08-29: 75 mg via ORAL
  Filled 2016-08-28: qty 1

## 2016-08-28 MED ORDER — PANTOPRAZOLE SODIUM 40 MG PO TBEC
40.0000 mg | DELAYED_RELEASE_TABLET | Freq: Every day | ORAL | Status: DC
  Administered 2016-08-28 – 2016-08-29 (×2): 40 mg via ORAL
  Filled 2016-08-28 (×2): qty 1

## 2016-08-28 MED ORDER — ONDANSETRON HCL 4 MG/2ML IJ SOLN: 4.0000 mg | Freq: Four times a day (QID) | INTRAMUSCULAR | Status: DC | PRN

## 2016-08-28 MED ORDER — FENTANYL CITRATE (PF) 100 MCG/2ML IJ SOLN
50.0000 ug | Freq: Once | INTRAMUSCULAR | Status: AC
  Administered 2016-08-28: 50 ug via INTRAVENOUS
  Filled 2016-08-28: qty 2

## 2016-08-28 MED ORDER — ADULT MULTIVITAMIN W/MINERALS CH
1.0000 | ORAL_TABLET | Freq: Every day | ORAL | Status: DC
  Administered 2016-08-29: 1 via ORAL
  Filled 2016-08-28: qty 1

## 2016-08-28 MED ORDER — ASPIRIN EC 81 MG PO TBEC
81.0000 mg | DELAYED_RELEASE_TABLET | Freq: Every day | ORAL | Status: DC
  Administered 2016-08-28 – 2016-08-29 (×2): 81 mg via ORAL
  Filled 2016-08-28 (×2): qty 1

## 2016-08-28 MED ORDER — MOMETASONE FURO-FORMOTEROL FUM 200-5 MCG/ACT IN AERO
2.0000 | INHALATION_SPRAY | Freq: Two times a day (BID) | RESPIRATORY_TRACT | Status: DC
  Administered 2016-08-28: 2 via RESPIRATORY_TRACT
  Filled 2016-08-28 (×2): qty 8.8

## 2016-08-28 MED ORDER — IOPAMIDOL (ISOVUE-300) INJECTION 61%
INTRAVENOUS | Status: AC
  Administered 2016-08-28: 100 mL
  Filled 2016-08-28: qty 100

## 2016-08-28 MED ORDER — METHYLPREDNISOLONE SODIUM SUCC 125 MG IJ SOLR
125.0000 mg | Freq: Once | INTRAMUSCULAR | Status: AC
  Administered 2016-08-28: 125 mg via INTRAVENOUS
  Filled 2016-08-28: qty 2

## 2016-08-28 MED ORDER — ACETAMINOPHEN 325 MG PO TABS: 650.0000 mg | ORAL_TABLET | Freq: Four times a day (QID) | ORAL | Status: DC | PRN

## 2016-08-28 MED ORDER — KETOROLAC TROMETHAMINE 15 MG/ML IJ SOLN
15.0000 mg | Freq: Four times a day (QID) | INTRAMUSCULAR | Status: DC
  Administered 2016-08-28 – 2016-08-29 (×4): 15 mg via INTRAVENOUS
  Filled 2016-08-28 (×4): qty 1

## 2016-08-28 MED ORDER — ACETAMINOPHEN 650 MG RE SUPP: 650.0000 mg | Freq: Four times a day (QID) | RECTAL | Status: DC | PRN

## 2016-08-28 NOTE — Progress Notes (Signed)
IRISH PIECH is a 71 y.o. male patient admitted from ED awake, alert - oriented  X 4 - no acute distress noted.  VSS - Blood pressure 123/75, pulse 92, temperature 98.7 F (37.1 C), temperature source Oral, resp. rate (!) 24, SpO2 91 %.    IV in place, occlusive dsg intact without redness.  Orientation to room, and floor completed with information packet given to patient/family.  Patient declined safety video at this time.  Admission INP armband ID verified with patient/family, and in place.   SR up x 2, fall assessment complete, with patient and family able to verbalize understanding of risk associated with falls, and verbalized understanding to call nsg before up out of bed.  Call light within reach, patient able to voice, and demonstrate understanding.  Skin, clean-dry- intact. Some scabs on right wrist.     Will cont to eval and treat per MD orders.  Celine Ahr, RN 08/28/2016 5:41 PM

## 2016-08-28 NOTE — ED Provider Notes (Signed)
[facility] DEPT Provider Note   CSN: [id number] Arrival date & time: 08/28/16  1305     History   Chief Complaint Chief Complaint  Patient presents with  . Fever    HPI Brent Gonzales is a 71 y.o. male.  HPI Patient presents with concern of lower chest wall pain, upper abdominal pain, fatigue. Pain began about a week ago, has become worse, steadily. No clear precipitant, and the patient denies any recent travel, new activity, new diet, new medication. He does have a history of COPD, states that he takes all medication as directed. He does not wear oxygen at home. During this illness he has had no vomiting, nausea, diarrhea. The pain is focally in the lower chest wall, but is more severe in the left upper abdomen, with pain in this area with deep inspiration, coughing.  No clear alleviating or exacerbating factors.  Past Medical History:  Diagnosis Date  . Aorto-iliac disease ([place])    mild bilateral external. ABIs in the 0.9 range  . Carotid artery disease ([place])    right ICA stenosis  . COPD (chronic obstructive pulmonary disease) ([place])   . Coronary artery disease   . High cholesterol   . Hyperlipidemia   . Hypertension   . Tobacco abuse     Patient Active Problem List   Diagnosis Date Noted  . Upper airway cough syndrome 07/19/2016  . COPD GOLD II  06/21/2016  . Peripheral arterial disease ([place]) 03/24/2014  . Essential hypertension 04/09/2013  . Hyperlipidemia 04/09/2013  . Cigarette smoker 04/09/2013  . Carotid artery disease ([place]) 04/09/2013    Past Surgical History:  Procedure Laterality Date  . DOPPLER ECHOCARDIOGRAPHY    . Duplex ultrasound    . HERNIA REPAIR     left and right  . NM MYOVIEW LTD  03/2010   Inferior septal ischemia  . stress myocardial perfusion    . TONSILLECTOMY AND ADENOIDECTOMY         Home Medications    Prior to Admission medications   Medication Sig Start Date End Date Taking? Authorizing Provider  albuterol  (PROVENTIL HFA;VENTOLIN HFA) 108 (90 Base) MCG/ACT inhaler Inhale 2 puffs into the lungs every 6 (six) hours as needed for wheezing or shortness of breath. 11/02/15   [name], MD  aspirin 81 MG tablet Take 81 mg by mouth daily.    [provider]  atorvastatin (LIPITOR) 40 MG tablet TAKE 1 TABLET (40 MG TOTAL) BY MOUTH DAILY. 09/05/15   [name], MD  budesonide-formoterol (SYMBICORT) 160-4.5 MCG/ACT inhaler Take 2 puffs first thing in am and then another 2 puffs about 12 hours later. 07/19/16   Tanda Rockers, MD  Cholecalciferol (VITAMIN D) 2000 UNITS CAPS Take 2,000 Units by mouth daily.    [provider]  clopidogrel (PLAVIX) 75 MG tablet Take 1 tablet (75 mg total) by mouth daily with breakfast. 05/28/16   Lorretta Harp, MD  Coenzyme Q10 (CO Q10) 100 MG CAPS Take 200 mg by mouth daily.    [provider]  famotidine (PEPCID) 20 MG tablet One at bedtime 06/21/16   Tanda Rockers, MD  glucosamine-chondroitin 500-400 MG tablet Take 1 tablet by mouth 2 (two) times daily.    [provider]  Multiple Vitamin (MULTIVITAMIN) capsule Take 1 capsule by mouth daily.    [provider]  pantoprazole (PROTONIX) 40 MG tablet Take 1 tablet (40 mg total) by mouth daily. Take 30-60 min before first meal  of the day 06/21/16   Tanda Rockers, MD    Family History History reviewed. No pertinent family history.  Social History Social History  Substance Use Topics  . Smoking status: Current Every Day Smoker    Packs/day: 0.50    Years: 45.00    Types: Cigarettes  . Smokeless tobacco: Never Used  . Alcohol use 0.6 oz/week    1 Cans of beer per week     Allergies   Penicillins   Review of Systems Review of Systems  Constitutional:       Per HPI, otherwise negative  HENT:       Per HPI, otherwise negative  Respiratory:       Per HPI, otherwise negative  Cardiovascular:       Per HPI, otherwise negative  Gastrointestinal: Negative  for vomiting.  Endocrine:       Negative aside from HPI  Genitourinary:       Neg aside from HPI   Musculoskeletal:       Per HPI, otherwise negative  Skin: Negative.   Neurological: Negative for syncope.     Physical Exam Updated Vital Signs BP 123/75   Pulse 92   Temp 98.7 F (37.1 C) (Oral)   Resp (!) 24   SpO2 91%   Physical Exam  Constitutional: He is oriented to person, place, and time. He appears well-developed. No distress.  HENT:  Head: Normocephalic and atraumatic.  Eyes: Conjunctivae and EOM are normal.  Cardiovascular: Normal rate and regular rhythm.   Pulmonary/Chest: Effort normal. No stridor. No respiratory distress.  Abdominal: He exhibits no distension.  Mild tenderness with guarding, left upper quadrant, no rebound, no diffuse peritonitis.  Musculoskeletal: He exhibits no edema.  Neurological: He is alert and oriented to person, place, and time.  Skin: Skin is warm and dry.  Psychiatric: He has a normal mood and affect.  Nursing note and vitals reviewed.    ED Treatments / Results  Labs (all labs ordered are listed, but only abnormal results are displayed) Labs Reviewed  COMPREHENSIVE METABOLIC PANEL - Abnormal; Notable for the following:       Result Value   Glucose, Bld 166 (*)    Albumin 3.0 (*)    AST 56 (*)    ALT 66 (*)    All other components within normal limits  CBC WITH DIFFERENTIAL/PLATELET - Abnormal; Notable for the following:    WBC 13.6 (*)    Neutro Abs 11.8 (*)    All other components within normal limits  LIPASE, BLOOD  URINALYSIS, ROUTINE W REFLEX MICROSCOPIC  BRAIN NATRIURETIC PEPTIDE  I-STAT CG4 LACTIC ACID, ED    Patient is hypoxic on room air, 87%.  Lens corrects to 96% with supplemental oxygen, 2 L, abnormal  Radiology Dg Chest 2 View  Result Date: 08/28/2016 CLINICAL DATA:  One day history of cough, shortness of breath, lower left-sided chest pain as well as left shoulder and neck pain and fever. History of  COPD, coronary artery disease, current smoker. EXAM: CHEST  2 VIEW COMPARISON:  PA and lateral chest x-ray of June 13, 2016. FINDINGS: There is new increased density at the left lung base. This is consistent with atelectasis or infiltrate with small pleural effusion. The right lung base is clear. The heart and pulmonary vascularity are normal. The mediastinum is normal in width. There is calcification in the wall of the aortic arch. The bony thorax is unremarkable. IMPRESSION: New left lower lobe atelectasis or pneumonia  with small left pleural effusion. Underlying COPD. Followup PA and lateral chest X-ray is recommended in 3-4 weeks following trial of antibiotic therapy to ensure resolution and exclude underlying malignancy. Thoracic aortic atherosclerosis. Electronically Signed   By: David  Martinique M.D.   On: 08/28/2016 14:52   Ct Abdomen Pelvis W Contrast  Result Date: 08/28/2016 CLINICAL DATA:  Left upper quadrant abdominal pain for the past week with intermittent fever. EXAM: CT ABDOMEN AND PELVIS WITH CONTRAST TECHNIQUE: Multidetector CT imaging of the abdomen and pelvis was performed using the standard protocol following bolus administration of intravenous contrast. CONTRAST:  171mL ISOVUE-300 IOPAMIDOL (ISOVUE-300) INJECTION 61% COMPARISON:  Abdomen pelvis radiographs dated 07/13/2013. FINDINGS: Lower chest: Bilateral lower lobe airspace opacity and volume loss, greater on the left. Small left pleural effusion. Hepatobiliary: Small calcified granulomata in the liver. Normal appearing gallbladder. Pancreas: Unremarkable. No pancreatic ductal dilatation or surrounding inflammatory changes. Spleen: Multiple small calcified granulomata. Normal in size and shape. Adrenals/Urinary Tract: Small upper pole left renal cortical calcification. No urinary tract calculi or hydronephrosis. Normal appearing adrenal glands. Stomach/Bowel: Large number of sigmoid colon diverticula without evidence of diverticulitis.  Normal appearing appendix, small bowel and stomach. Vascular/Lymphatic: Atheromatous arterial calcifications and plaques without aneurysm. Mildly enlarged lymph node at the root of the mesenteries on the left, with a short axis diameter of 11 mm on image number 26 of series 3. There is also a mildly enlarged peripancreatic/gastrohepatic ligament node with a short axis diameter of 11 mm on image number 31 of series 3. There are additional borderline enlarged lymph nodes in the upper abdomen. No retroperitoneal adenopathy. Reproductive: Normal sized prostate gland. Other: No abdominal wall hernia or abnormality. No abdominopelvic ascites. Musculoskeletal: Lumbar and lower thoracic spine degenerative changes and straightening of the normal lumbar lordosis. Mild dextroconvex thoracolumbar scoliosis. IMPRESSION: 1. Left lower lobe pneumonia and atelectasis with a small left pleural effusion. 2. Milder right lower lobe atelectasis and possible pneumonia. 3. Mild upper abdominal adenopathy, most likely reactive in the absence of a visible neoplasm. Electronically Signed   By: Claudie Revering M.D.   On: 08/28/2016 15:27    Procedures Procedures (including critical care time)  Medications Ordered in ED Medications  methylPREDNISolone sodium succinate (SOLU-MEDROL) 125 mg/2 mL injection 125 mg (not administered)  levofloxacin (LEVAQUIN) IVPB 750 mg (not administered)  iopamidol (ISOVUE-300) 61 % injection (100 mLs  Contrast Given 08/28/16 1459)  fentaNYL (SUBLIMAZE) injection 50 mcg (50 mcg Intravenous Given 08/28/16 1507)     Initial Impression / Assessment and Plan / ED Course  I have reviewed the triage vital signs and the nursing notes.  Pertinent labs & imaging results that were available during my care of the patient were reviewed by me and considered in my medical decision making (see chart for details).  This patient with COPD presents with abdominal pain, chest pain. Patient found to be hypoxic, and  given his concern of discomfort, CT scan was performed in addition to x-ray, labs. Findings consistent with multilobar pneumonia, and given his hypoxia, history of COPD, after initiation of steroids, antibiotic, he was admitted for further evaluation and management.  Final Clinical Impressions(s) / ED Diagnoses  Community-acquired pneumonia Hypoxia   Carmin Muskrat, MD 08/28/16 1542

## 2016-08-28 NOTE — ED Notes (Signed)
Patient provided with mouth swap for comfort.

## 2016-08-28 NOTE — ED Triage Notes (Signed)
[facility]; appeared to be in moderate distress; patient describes as L abdominal quadrant. Tender to movement and palpation; rigid and distended. 103.3 per EMS. 1000 tylenol given per EMS; 18 L Hand. Initial pressure 188/102.

## 2016-08-28 NOTE — Progress Notes (Signed)
Received report on pt.

## 2016-08-28 NOTE — H&P (Signed)
History and Physical    Brent Gonzales XTK:[id number] DOB: [birth year] DOA: 08/28/2016   PCP: [name], MD   Patient coming from/Resides with: Private residence/wife  Admission status: Observation/floor -it may be medically necessary to stay a minimum 2 midnights to rule out impending and/or unexpected changes in physiologic status that may differ from initial evaluation performed in the ER and/or at time of admission- consider reevaluation of admission status in 24 hours.   Chief Complaint: Left lower rib cage pain and shortness of breath  HPI: Brent Gonzales is a 71 y.o. male with medical history significant for CAD and other vascular disease, COPD followed by Dr. Melvyn Novas as an outpatient, continued tobacco abuse, dyslipidemia and hypertension. Patient reports 1 week ago he developed generalized malaise with pleuritic type pain just below the left rib cage radiating to the left shoulder. This was associated with mild nonproductive cough. He also felt like because of the pain he could not get in a good deep breath. He had a MAXIMUM TEMPERATURE of 102F last week. He has been very fatigued. He has had adequate oral intake. He has not had nausea vomiting or diarrhea. No sick contacts. He is been utilizing Tylenol and ibuprofen for the symptoms without improvement.   ED Course:  Vital Signs: BP 123/75   Pulse 92   Temp 98.7 F (37.1 C) (Oral)   Resp (!) 24   SpO2 91%  2 view CXR: NEW LEFT lower lobe atelectasis/pneumonia with small left pleural effusion and underlying COPD CT abdomen and pelvis with contrast: Left lower lobe pneumonia with atelectasis and small effusion, mild right lower lobe atelectasis and possible pneumonia, mild upper abdominal adenopathy likely reactive in the absence of visible neoplasm Lab data: Sodium 137, potassium 3.9, chloride 105, CO2 23, glucose 166, BUN 14, creatinine 1.11, albumin 3, AST 56, ALT 66, lactic acid 0.78, white count 13,600 with neutrophils 87%  and absolute neutrophils 1.8%, hemoglobin 14.1, platelets 283,000 Medications and treatments: Fentanyl 50 g IV 1, Solu-Medrol 125 mg IV 1, Levaquin 750 mg IV 1  Review of Systems:  In addition to the HPI above,  No Headache, changes with Vision or hearing, new weakness, tingling, numbness in any extremity, dizziness, dysarthria or word finding difficulty, gait disturbance or imbalance, tremors or seizure activity No problems swallowing food or Liquids, indigestion/reflux, choking or coughing while eating, abdominal pain with or after eating No palpitations, orthopnea  No Abdominal pain, N/V, melena,hematochezia, dark tarry stools, constipation No dysuria, malodorous urine, hematuria or flank pain No new skin rashes, lesions, masses or bruises, No new joint pains, aches, swelling or redness No recent unintentional weight gain or loss No polyuria, polydypsia or polyphagia   Past Medical History:  Diagnosis Date  . Aorto-iliac disease ([place])    mild bilateral external. ABIs in the 0.9 range  . Carotid artery disease ([place])    right ICA stenosis  . COPD (chronic obstructive pulmonary disease) ([place])   . Coronary artery disease   . High cholesterol   . Hyperlipidemia   . Hypertension   . Tobacco abuse     Past Surgical History:  Procedure Laterality Date  . DOPPLER ECHOCARDIOGRAPHY    . Duplex ultrasound    . HERNIA REPAIR     left and right  . NM MYOVIEW LTD  03/2010   Inferior septal ischemia  . stress myocardial perfusion    . TONSILLECTOMY AND ADENOIDECTOMY      Social History   Social History  .  Marital status: Married    Spouse name: N/A  . Number of children: N/A  . Years of education: N/A   Occupational History  . Not on file.   Social History Main Topics  . Smoking status: Current Every Day Smoker    Packs/day: 0.50    Years: 45.00    Types: Cigarettes  . Smokeless tobacco: Never Used  . Alcohol use 0.6 oz/week    1 Cans of beer per week  . Drug use:  No  . Sexual activity: Yes   Other Topics Concern  . Not on file   Social History Narrative  . No narrative on file    Mobility: Independent Work history: Not obtained   Allergies  Allergen Reactions  . Penicillins Other (See Comments)    Family history if this allergy Has patient had a PCN reaction causing immediate rash, facial/tongue/throat swelling, SOB or lightheadedness with hypotension: No Has patient had a PCN reaction causing severe rash involving mucus membranes or skin necrosis: No Has patient had a PCN reaction that required hospitalization: No Has patient had a PCN reaction occurring within the last 10 years: No If all of the above answers are "NO", then may proceed with Cephalosporin use.      Family history reviewed and not pertinent To current admission findings her diagnosis  Prior to Admission medications   Medication Sig Start Date End Date Taking? Authorizing Provider  Multiple Vitamin (MULTIVITAMIN) capsule Take 1 capsule by mouth daily.   Yes [provider]  pantoprazole (PROTONIX) 40 MG tablet Take 1 tablet (40 mg total) by mouth daily. Take 30-60 min before first meal of the day 06/21/16  Yes Tanda Rockers, MD  albuterol (PROVENTIL HFA;VENTOLIN HFA) 108 (90 Base) MCG/ACT inhaler Inhale 2 puffs into the lungs every 6 (six) hours as needed for wheezing or shortness of breath. 11/02/15   [name], MD  aspirin 81 MG tablet Take 81 mg by mouth daily.    [provider]  atorvastatin (LIPITOR) 40 MG tablet TAKE 1 TABLET (40 MG TOTAL) BY MOUTH DAILY. 09/05/15   [name], MD  budesonide-formoterol (SYMBICORT) 160-4.5 MCG/ACT inhaler Take 2 puffs first thing in am and then another 2 puffs about 12 hours later. 07/19/16   Tanda Rockers, MD  Cholecalciferol (VITAMIN D) 2000 UNITS CAPS Take 2,000 Units by mouth daily.    [provider]  clopidogrel (PLAVIX) 75 MG tablet Take 1 tablet (75 mg total) by mouth daily with  breakfast. 05/28/16   Lorretta Harp, MD  Coenzyme Q10 (CO Q10) 100 MG CAPS Take 200 mg by mouth daily.    [provider]  famotidine (PEPCID) 20 MG tablet One at bedtime 06/21/16   Tanda Rockers, MD  glucosamine-chondroitin 500-400 MG tablet Take 1 tablet by mouth 2 (two) times daily.    [provider]    Physical Exam: Vitals:   08/28/16 1332 08/28/16 1336 08/28/16 1400 08/28/16 1532  BP: 134/73  113/64 123/75  Pulse: (!) 105 (!) 108 98 92  Resp: 20 17 19  (!) 24  Temp:      TempSrc:      SpO2: (!) 87% 91% 92% 91%      Constitutional: NAD, calm, comfortable Eyes: PERRL, lids and conjunctivae normal ENMT: Mucous membranes are moist. Posterior pharynx clear of any exudate or lesions.Normal dentition.  Neck: normal, supple, no masses, no thyromegaly Respiratory: Coarse to auscultation with bilateral crackles mid fields down;  Normal  respiratory effort without accessory muscle use at rest. Tender to palpation just below the left rib cage room air saturations 86% in ER with improvement in O2 saturations to 95-97% on nasal cannula oxygen Cardiovascular: Regular rate and rhythm, no murmurs / rubs / gallops. No extremity edema. 2+ pedal pulses. No carotid bruits.  Abdomen: no tenderness, no masses palpated. No hepatosplenomegaly. Bowel sounds positive.  Musculoskeletal: no clubbing / cyanosis. No joint deformity upper and lower extremities. Good ROM, no contractures. Normal muscle tone.  Skin: no rashes, lesions, ulcers. No induration Neurologic: CN 2-12 grossly intact. Sensation intact, DTR normal. Strength 5/5 x all 4 extremities.  Psychiatric: Normal judgment and insight. Alert and oriented x 3. Normal mood.    Labs on Admission: I have personally reviewed following labs and imaging studies  CBC:  Recent Labs Lab 08/28/16 1331  WBC 13.6*  NEUTROABS 11.8*  HGB 14.1  HCT 40.8  MCV 94.0  PLT 850   Basic Metabolic Panel:  Recent Labs Lab 08/28/16 1331    NA 137  K 3.9  CL 105  CO2 23  GLUCOSE 166*  BUN 14  CREATININE 1.11  CALCIUM 9.0   GFR: CrCl cannot be calculated (Unknown ideal weight.). Liver Function Tests:  Recent Labs Lab 08/28/16 1331  AST 56*  ALT 66*  ALKPHOS 66  BILITOT 0.6  PROT 7.1  ALBUMIN 3.0*    Recent Labs Lab 08/28/16 1331  LIPASE 18   No results for input(s): AMMONIA in the last 168 hours. Coagulation Profile: No results for input(s): INR, PROTIME in the last 168 hours. Cardiac Enzymes: No results for input(s): CKTOTAL, CKMB, CKMBINDEX, TROPONINI in the last 168 hours. BNP (last 3 results) No results for input(s): PROBNP in the last 8760 hours. HbA1C: No results for input(s): HGBA1C in the last 72 hours. CBG: No results for input(s): GLUCAP in the last 168 hours. Lipid Profile: No results for input(s): CHOL, HDL, LDLCALC, TRIG, CHOLHDL, LDLDIRECT in the last 72 hours. Thyroid Function Tests: No results for input(s): TSH, T4TOTAL, FREET4, T3FREE, THYROIDAB in the last 72 hours. Anemia Panel: No results for input(s): VITAMINB12, FOLATE, FERRITIN, TIBC, IRON, RETICCTPCT in the last 72 hours. Urine analysis: No results found for: COLORURINE, APPEARANCEUR, LABSPEC, PHURINE, GLUCOSEU, HGBUR, BILIRUBINUR, KETONESUR, PROTEINUR, UROBILINOGEN, NITRITE, LEUKOCYTESUR Sepsis Labs: @LABRCNTIP (procalcitonin:4,lacticidven:4) )No results found for this or any previous visit (from the past 240 hour(s)).   Radiological Exams on Admission: Dg Chest 2 View  Result Date: 08/28/2016 CLINICAL DATA:  One day history of cough, shortness of breath, lower left-sided chest pain as well as left shoulder and neck pain and fever. History of COPD, coronary artery disease, current smoker. EXAM: CHEST  2 VIEW COMPARISON:  PA and lateral chest x-ray of June 13, 2016. FINDINGS: There is new increased density at the left lung base. This is consistent with atelectasis or infiltrate with small pleural effusion. The right lung  base is clear. The heart and pulmonary vascularity are normal. The mediastinum is normal in width. There is calcification in the wall of the aortic arch. The bony thorax is unremarkable. IMPRESSION: New left lower lobe atelectasis or pneumonia with small left pleural effusion. Underlying COPD. Followup PA and lateral chest X-ray is recommended in 3-4 weeks following trial of antibiotic therapy to ensure resolution and exclude underlying malignancy. Thoracic aortic atherosclerosis. Electronically Signed   By: David  Martinique M.D.   On: 08/28/2016 14:52   Ct Abdomen Pelvis W Contrast  Result Date: 08/28/2016 CLINICAL DATA:  Left  upper quadrant abdominal pain for the past week with intermittent fever. EXAM: CT ABDOMEN AND PELVIS WITH CONTRAST TECHNIQUE: Multidetector CT imaging of the abdomen and pelvis was performed using the standard protocol following bolus administration of intravenous contrast. CONTRAST:  152mL ISOVUE-300 IOPAMIDOL (ISOVUE-300) INJECTION 61% COMPARISON:  Abdomen pelvis radiographs dated 07/13/2013. FINDINGS: Lower chest: Bilateral lower lobe airspace opacity and volume loss, greater on the left. Small left pleural effusion. Hepatobiliary: Small calcified granulomata in the liver. Normal appearing gallbladder. Pancreas: Unremarkable. No pancreatic ductal dilatation or surrounding inflammatory changes. Spleen: Multiple small calcified granulomata. Normal in size and shape. Adrenals/Urinary Tract: Small upper pole left renal cortical calcification. No urinary tract calculi or hydronephrosis. Normal appearing adrenal glands. Stomach/Bowel: Large number of sigmoid colon diverticula without evidence of diverticulitis. Normal appearing appendix, small bowel and stomach. Vascular/Lymphatic: Atheromatous arterial calcifications and plaques without aneurysm. Mildly enlarged lymph node at the root of the mesenteries on the left, with a short axis diameter of 11 mm on image number 26 of series 3. There is  also a mildly enlarged peripancreatic/gastrohepatic ligament node with a short axis diameter of 11 mm on image number 31 of series 3. There are additional borderline enlarged lymph nodes in the upper abdomen. No retroperitoneal adenopathy. Reproductive: Normal sized prostate gland. Other: No abdominal wall hernia or abnormality. No abdominopelvic ascites. Musculoskeletal: Lumbar and lower thoracic spine degenerative changes and straightening of the normal lumbar lordosis. Mild dextroconvex thoracolumbar scoliosis. IMPRESSION: 1. Left lower lobe pneumonia and atelectasis with a small left pleural effusion. 2. Milder right lower lobe atelectasis and possible pneumonia. 3. Mild upper abdominal adenopathy, most likely reactive in the absence of a visible neoplasm. Electronically Signed   By: Claudie Revering M.D.   On: 08/28/2016 15:27    Assessment/Plan Principal Problem:  Acute respiratory failure with hypoxia 2/2 CAP (community acquired pneumonia) -Presents with pleuritic chest pain with generalized malaise fevers and shortness of breath 1 week with radiographic evidence of bilateral pneumonia-also leukocytosis -Treat as community-acquired pneumonia: Levaquin -Obtain blood cultures and sputum culture -Urinary strep and Legionella -Respiratory viral panel and influenza PCR -Supportive care with oxygen-wean as tolerated-may benefit from short-term home O2 to decrease length of stay -IV Toradol scheduled for pleuritic chest discomfort (continue PPI) -Seems dehydrated so will initiate IV fluids at 75 per hour-also received IV contrast for CT earlier today  Active Problems:   Transaminitis -Likely secondary to acute illness -Hold statin -Repeat LFTs in a.m.    COPD (chronic obstructive pulmonary disease)  -Not actively wheezing and does not appear to be expressing acute exacerbation -Follow up with pulmonologist after discharge -Continue home Symbicort and albuterol MDI    HTN  (hypertension) -Moderately controlled -Not on antihypertensive therapy prior to admission    HLD (hyperlipidemia) -Hold statin in setting of mild transaminitis -Obtain CK    CAD (coronary artery disease) -Currently chest pain not consistent with prior ischemic pain-patient instructed to notify staff if develops typical ischemic pain -Continue preadmission low dose aspirin -Statin on hold as above -Continue Plavix -Was not on beta blocker prior to admission      DVT prophylaxis: Lovenox Code Status: Full Family Communication: Wife Disposition Plan: Home Consults called: None    Adelei Scobey L. ANP-BC [facility] Pager [phone]   If 7PM-7AM, please contact night-coverage [email or web address] Password [facility]  08/28/2016, 4:37 PM

## 2016-08-29 DIAGNOSIS — J189 Pneumonia, unspecified organism: Secondary | ICD-10-CM | POA: Diagnosis not present

## 2016-08-29 DIAGNOSIS — E784 Other hyperlipidemia: Secondary | ICD-10-CM | POA: Diagnosis not present

## 2016-08-29 DIAGNOSIS — J9601 Acute respiratory failure with hypoxia: Secondary | ICD-10-CM | POA: Diagnosis not present

## 2016-08-29 DIAGNOSIS — R740 Nonspecific elevation of levels of transaminase and lactic acid dehydrogenase [LDH]: Secondary | ICD-10-CM | POA: Diagnosis not present

## 2016-08-29 DIAGNOSIS — J181 Lobar pneumonia, unspecified organism: Secondary | ICD-10-CM | POA: Diagnosis not present

## 2016-08-29 DIAGNOSIS — J449 Chronic obstructive pulmonary disease, unspecified: Secondary | ICD-10-CM | POA: Diagnosis not present

## 2016-08-29 LAB — CBC
HCT: 37.9 % — ABNORMAL LOW (ref 39.0–52.0)
HEMOGLOBIN: 12.8 g/dL — AB (ref 13.0–17.0)
MCH: 31.8 pg (ref 26.0–34.0)
MCHC: 33.8 g/dL (ref 30.0–36.0)
MCV: 94.3 fL (ref 78.0–100.0)
PLATELETS: 269 10*3/uL (ref 150–400)
RBC: 4.02 MIL/uL — AB (ref 4.22–5.81)
RDW: 13.5 % (ref 11.5–15.5)
WBC: 18.2 10*3/uL — AB (ref 4.0–10.5)

## 2016-08-29 LAB — RESPIRATORY PANEL BY PCR
ADENOVIRUS-RVPPCR: NOT DETECTED
Bordetella pertussis: NOT DETECTED
CORONAVIRUS 229E-RVPPCR: NOT DETECTED
CORONAVIRUS NL63-RVPPCR: NOT DETECTED
CORONAVIRUS OC43-RVPPCR: NOT DETECTED
Chlamydophila pneumoniae: NOT DETECTED
Coronavirus HKU1: NOT DETECTED
Influenza A: NOT DETECTED
Influenza B: NOT DETECTED
METAPNEUMOVIRUS-RVPPCR: NOT DETECTED
MYCOPLASMA PNEUMONIAE-RVPPCR: NOT DETECTED
PARAINFLUENZA VIRUS 1-RVPPCR: NOT DETECTED
PARAINFLUENZA VIRUS 2-RVPPCR: NOT DETECTED
Parainfluenza Virus 3: NOT DETECTED
Parainfluenza Virus 4: NOT DETECTED
RESPIRATORY SYNCYTIAL VIRUS-RVPPCR: NOT DETECTED
Rhinovirus / Enterovirus: DETECTED — AB

## 2016-08-29 LAB — COMPREHENSIVE METABOLIC PANEL
ALK PHOS: 64 U/L (ref 38–126)
ALT: 43 U/L (ref 17–63)
AST: 25 U/L (ref 15–41)
Albumin: 2.5 g/dL — ABNORMAL LOW (ref 3.5–5.0)
Anion gap: 10 (ref 5–15)
BUN: 22 mg/dL — ABNORMAL HIGH (ref 6–20)
CO2: 22 mmol/L (ref 22–32)
Calcium: 8.8 mg/dL — ABNORMAL LOW (ref 8.9–10.3)
Chloride: 108 mmol/L (ref 101–111)
Creatinine, Ser: 1.17 mg/dL (ref 0.61–1.24)
GFR calc Af Amer: >60 mL/min (ref >60)
GLUCOSE: 120 mg/dL — AB (ref 65–99)
POTASSIUM: 3.9 mmol/L (ref 3.5–5.1)
Sodium: 140 mmol/L (ref 135–145)
Total Bilirubin: 0.7 mg/dL (ref 0.3–1.2)
Total Protein: 6.4 g/dL — ABNORMAL LOW (ref 6.5–8.1)

## 2016-08-29 LAB — EXPECTORATED SPUTUM ASSESSMENT W GRAM STAIN, RFLX TO RESP C

## 2016-08-29 LAB — EXPECTORATED SPUTUM ASSESSMENT W REFEX TO RESP CULTURE

## 2016-08-29 LAB — HIV ANTIBODY (ROUTINE TESTING W REFLEX): HIV Screen 4th Generation wRfx: NONREACTIVE

## 2016-08-29 MED ORDER — LEVOFLOXACIN 750 MG PO TABS: 750.0000 mg | ORAL_TABLET | Freq: Every day | ORAL | 0 refills | Status: AC

## 2016-08-29 NOTE — Progress Notes (Signed)
Room air patient's O2 sat 93%. Ambulated in hall on room air patient's O2 sat 90-93%. No need to re-place O2.  Will continue to monitor.  Monteverde, Esau Fridman C 08/29/2016  3:08 PM

## 2016-08-29 NOTE — Progress Notes (Signed)
Jule Economy discharged Home per MD order.  Discharge instructions reviewed and discussed with the patient, all questions and concerns answered. Copy of instructions and care notes for new medications & diagnosis given to patient.  Allergies as of 08/29/2016      Reactions   Penicillins Other (See Comments)   Family history if this allergy Has patient had a PCN reaction causing immediate rash, facial/tongue/throat swelling, SOB or lightheadedness with hypotension: No Has patient had a PCN reaction causing severe rash involving mucus membranes or skin necrosis: No Has patient had a PCN reaction that required hospitalization: No Has patient had a PCN reaction occurring within the last 10 years: No If all of the above answers are "NO", then may proceed with Cephalosporin use.      Medication List    STOP taking these medications   famotidine 20 MG tablet Commonly known as:  PEPCID   naproxen sodium 220 MG tablet Commonly known as:  ANAPROX     TAKE these medications   acetaminophen 500 MG tablet Commonly known as:  TYLENOL Take 500 mg by mouth every 6 (six) hours as needed (for pain or headaches).   albuterol 108 (90 Base) MCG/ACT inhaler Commonly known as:  PROVENTIL HFA;VENTOLIN HFA Inhale 2 puffs into the lungs every 6 (six) hours as needed for wheezing or shortness of breath.   aspirin 81 MG tablet Take 81 mg by mouth daily.   atorvastatin 40 MG tablet Commonly known as:  LIPITOR TAKE 1 TABLET (40 MG TOTAL) BY MOUTH DAILY.   budesonide-formoterol 160-4.5 MCG/ACT inhaler Commonly known as:  SYMBICORT Take 2 puffs first thing in am and then another 2 puffs about 12 hours later.   cetirizine 10 MG tablet Commonly known as:  ZYRTEC Take 10 mg by mouth daily as needed for allergies.   clopidogrel 75 MG tablet Commonly known as:  PLAVIX Take 1 tablet (75 mg total) by mouth daily with breakfast.   Co Q10 100 MG Caps Take 200 mg by mouth daily.   glucosamine-chondroitin  500-400 MG tablet Take 1 tablet by mouth 2 (two) times daily.   ibuprofen 200 MG tablet Commonly known as:  ADVIL,MOTRIN Take 400 mg by mouth every 6 (six) hours as needed (for pain or headaches).   levofloxacin 750 MG tablet Commonly known as:  LEVAQUIN Take 1 tablet (750 mg total) by mouth daily.   multivitamin capsule Take 1 capsule by mouth daily.   pantoprazole 40 MG tablet Commonly known as:  PROTONIX Take 1 tablet (40 mg total) by mouth daily. Take 30-60 min before first meal of the day   Vitamin D 2000 units Caps Take 2,000 Units by mouth daily.       Patients skin is clean, dry and intact, no evidence of skin break down. IV site discontinued and catheter remains intact. Site without signs and symptoms of complications. Dressing and pressure applied.  Patient escorted to car by NT in a wheelchair,  no distress noted upon discharge.  Opdahl, Dugan Vanhoesen C 08/29/2016 6:21 PM

## 2016-08-29 NOTE — Care Management Obs Status (Signed)
[place] NOTIFICATION   Patient Details  Name: Brent Gonzales MRN: [id number] Date of Birth: 21-Aug-1945   Medicare Observation Status Notification Given:  Yes    Sharin Mons, RN 08/29/2016, 12:40 PM

## 2016-08-29 NOTE — Discharge Summary (Signed)
Physician Discharge Summary  Brent Gonzales ALP:[id number] DOB: 1946-02-10 DOA: 08/28/2016  PCP: [name], MD  Admit date: 08/28/2016 Discharge date: 08/29/2016  Admitted From: Home  Disposition: Home   Recommendations for Outpatient Follow-up:  1. Follow up with PCP in 3-5 days  2. Please obtain repeat CXR in 4-6 weeks to ensure resolution 3. Please follow final blood and sputum culture results.    Discharge Condition: STABLE  CODE STATUS: FULL   Brief/Interim Summary: HPI: Brent Gonzales is a 71 y.o. male with medical history significant for CAD and other vascular disease, COPD followed by Dr. Melvyn Novas as an outpatient, continued tobacco abuse, dyslipidemia and hypertension. Patient reports 1 week ago he developed generalized malaise with pleuritic type pain just below the left rib cage radiating to the left shoulder. This was associated with mild nonproductive cough. He also felt like because of the pain he could not get in a good deep breath. He had a MAXIMUM TEMPERATURE of 102F last week. He has been very fatigued. He has had adequate oral intake. He has not had nausea vomiting or diarrhea. No sick contacts. He is been utilizing Tylenol and ibuprofen for the symptoms without improvement.   ED Course:  Vital Signs: BP 123/75   Pulse 92   Temp 98.7 F (37.1 C) (Oral)   Resp (!) 24   SpO2 91%  2 view CXR: NEW LEFT lower lobe atelectasis/pneumonia with small left pleural effusion and underlying COPD CT abdomen and pelvis with contrast: Left lower lobe pneumonia with atelectasis and small effusion, mild right lower lobe atelectasis and possible pneumonia, mild upper abdominal adenopathy likely reactive in the absence of visible neoplasm Lab data: Sodium 137, potassium 3.9, chloride 105, CO2 23, glucose 166, BUN 14, creatinine 1.11, albumin 3, AST 56, ALT 66, lactic acid 0.78, white count 13,600 with neutrophils 87% and absolute neutrophils 1.8%, hemoglobin 14.1, platelets  283,000 Medications and treatments: Fentanyl 50 g IV 1, Solu-Medrol 125 mg IV 1, Levaquin 750 mg IV 1  Acute respiratory failure with hypoxia 2/2 CAP (community acquired pneumonia) -Presents with pleuritic chest pain with generalized malaise fevers and shortness of breath 1 week with radiographic evidence of bilateral pneumonia-also leukocytosis -Treat as community-acquired pneumonia: Levaquin -final blood cultures and sputum culture pending  -Respiratory viral panel positive for rhinovirus/enterovirus -Supportive care with oxygen-wean as tolerated-may benefit from short-term home O2 to decrease length of stay, will evaluate for home oxygen prior to discharge -IV Toradol was ordered for pleuritic chest discomfort (continue PPI) -Given IV fluids at 75 per hour-also received IV contrast for CT   Transaminitis -Likely secondary to acute illness -Held statin -Repeat LFTs within normal limits    COPD (chronic obstructive pulmonary disease)  -Not actively wheezing and does not appear to be expressing acute exacerbation -Follow up with pulmonologist after discharge -Continue home Symbicort and albuterol MDI    HTN (hypertension) -Moderately controlled -Not on antihypertensive therapy prior to admission, follow up with PCP    HLD (hyperlipidemia) -Hold statin in setting of mild transaminitis -CK normal   CAD (coronary artery disease) -Currently chest pain not consistent with prior ischemic pain-patient instructed to notify staff if develops typical ischemic pain -Continue preadmission low dose aspirin -Statin on hold as above -Continue Plavix -Was not on beta blocker prior to admission  DVT prophylaxis: Lovenox Code Status: Full Family Communication: Wife Disposition Plan: Home  Discharge Diagnoses:  Principal Problem:   CAP (community acquired pneumonia) Active Problems:   Acute respiratory failure  with hypoxia ([place])   COPD (chronic obstructive pulmonary disease)  ([facility])   HTN (hypertension)   HLD (hyperlipidemia)   CAD (coronary artery disease)   Transaminitis   Lobar pneumonia [facility])  Discharge Instructions  Discharge Instructions    Diet - low sodium heart healthy    Complete by:  As directed    Increase activity slowly    Complete by:  As directed      Allergies as of 08/29/2016      Reactions   Penicillins Other (See Comments)   Family history if this allergy Has patient had a PCN reaction causing immediate rash, facial/tongue/throat swelling, SOB or lightheadedness with hypotension: No Has patient had a PCN reaction causing severe rash involving mucus membranes or skin necrosis: No Has patient had a PCN reaction that required hospitalization: No Has patient had a PCN reaction occurring within the last 10 years: No If all of the above answers are "NO", then may proceed with Cephalosporin use.      Medication List    STOP taking these medications   famotidine 20 MG tablet Commonly known as:  PEPCID   naproxen sodium 220 MG tablet Commonly known as:  ANAPROX     TAKE these medications   acetaminophen 500 MG tablet Commonly known as:  TYLENOL Take 500 mg by mouth every 6 (six) hours as needed (for pain or headaches).   albuterol 108 (90 Base) MCG/ACT inhaler Commonly known as:  PROVENTIL HFA;VENTOLIN HFA Inhale 2 puffs into the lungs every 6 (six) hours as needed for wheezing or shortness of breath.   aspirin 81 MG tablet Take 81 mg by mouth daily.   atorvastatin 40 MG tablet Commonly known as:  LIPITOR TAKE 1 TABLET (40 MG TOTAL) BY MOUTH DAILY.   budesonide-formoterol 160-4.5 MCG/ACT inhaler Commonly known as:  SYMBICORT Take 2 puffs first thing in am and then another 2 puffs about 12 hours later.   cetirizine 10 MG tablet Commonly known as:  ZYRTEC Take 10 mg by mouth daily as needed for allergies.   clopidogrel 75 MG tablet Commonly known as:  PLAVIX Take 1 tablet (75 mg total) by mouth daily with breakfast.    Co Q10 100 MG Caps Take 200 mg by mouth daily.   glucosamine-chondroitin 500-400 MG tablet Take 1 tablet by mouth 2 (two) times daily.   ibuprofen 200 MG tablet Commonly known as:  ADVIL,MOTRIN Take 400 mg by mouth every 6 (six) hours as needed (for pain or headaches).   levofloxacin 750 MG tablet Commonly known as:  LEVAQUIN Take 1 tablet (750 mg total) by mouth daily.   multivitamin capsule Take 1 capsule by mouth daily.   pantoprazole 40 MG tablet Commonly known as:  PROTONIX Take 1 tablet (40 mg total) by mouth daily. Take 30-60 min before first meal of the day   Vitamin D 2000 units Caps Take 2,000 Units by mouth daily.      Follow-up Information    [name], MD. Schedule an appointment as soon as possible for a visit in 5 day(s).   Specialty:  Family Medicine Contact information: [address] [place] [address] [phone]          Allergies  Allergen Reactions  . Penicillins Other (See Comments)    Family history if this allergy Has patient had a PCN reaction causing immediate rash, facial/tongue/throat swelling, SOB or lightheadedness with hypotension: No Has patient had a PCN reaction causing severe rash involving  mucus membranes or skin necrosis: No Has patient had a PCN reaction that required hospitalization: No Has patient had a PCN reaction occurring within the last 10 years: No If all of the above answers are "NO", then may proceed with Cephalosporin use.    Procedures/Studies: Dg Chest 2 View  Result Date: 08/28/2016 CLINICAL DATA:  One day history of cough, shortness of breath, lower left-sided chest pain as well as left shoulder and neck pain and fever. History of COPD, coronary artery disease, current smoker. EXAM: CHEST  2 VIEW COMPARISON:  PA and lateral chest x-ray of June 13, 2016. FINDINGS: There is new increased density at the left lung base. This is consistent with atelectasis or infiltrate with small pleural  effusion. The right lung base is clear. The heart and pulmonary vascularity are normal. The mediastinum is normal in width. There is calcification in the wall of the aortic arch. The bony thorax is unremarkable. IMPRESSION: New left lower lobe atelectasis or pneumonia with small left pleural effusion. Underlying COPD. Followup PA and lateral chest X-ray is recommended in 3-4 weeks following trial of antibiotic therapy to ensure resolution and exclude underlying malignancy. Thoracic aortic atherosclerosis. Electronically Signed   By: David  Martinique M.D.   On: 08/28/2016 14:52   Ct Abdomen Pelvis W Contrast  Result Date: 08/28/2016 CLINICAL DATA:  Left upper quadrant abdominal pain for the past week with intermittent fever. EXAM: CT ABDOMEN AND PELVIS WITH CONTRAST TECHNIQUE: Multidetector CT imaging of the abdomen and pelvis was performed using the standard protocol following bolus administration of intravenous contrast. CONTRAST:  151mL ISOVUE-300 IOPAMIDOL (ISOVUE-300) INJECTION 61% COMPARISON:  Abdomen pelvis radiographs dated 07/13/2013. FINDINGS: Lower chest: Bilateral lower lobe airspace opacity and volume loss, greater on the left. Small left pleural effusion. Hepatobiliary: Small calcified granulomata in the liver. Normal appearing gallbladder. Pancreas: Unremarkable. No pancreatic ductal dilatation or surrounding inflammatory changes. Spleen: Multiple small calcified granulomata. Normal in size and shape. Adrenals/Urinary Tract: Small upper pole left renal cortical calcification. No urinary tract calculi or hydronephrosis. Normal appearing adrenal glands. Stomach/Bowel: Large number of sigmoid colon diverticula without evidence of diverticulitis. Normal appearing appendix, small bowel and stomach. Vascular/Lymphatic: Atheromatous arterial calcifications and plaques without aneurysm. Mildly enlarged lymph node at the root of the mesenteries on the left, with a short axis diameter of 11 mm on image number 26  of series 3. There is also a mildly enlarged peripancreatic/gastrohepatic ligament node with a short axis diameter of 11 mm on image number 31 of series 3. There are additional borderline enlarged lymph nodes in the upper abdomen. No retroperitoneal adenopathy. Reproductive: Normal sized prostate gland. Other: No abdominal wall hernia or abnormality. No abdominopelvic ascites. Musculoskeletal: Lumbar and lower thoracic spine degenerative changes and straightening of the normal lumbar lordosis. Mild dextroconvex thoracolumbar scoliosis. IMPRESSION: 1. Left lower lobe pneumonia and atelectasis with a small left pleural effusion. 2. Milder right lower lobe atelectasis and possible pneumonia. 3. Mild upper abdominal adenopathy, most likely reactive in the absence of a visible neoplasm. Electronically Signed   By: Claudie Revering M.D.   On: 08/28/2016 15:27    (Echo, Carotid, EGD, Colonoscopy, ERCP)   Subjective: Pt says he feels noticeably better today.  Much less SOB.   Discharge Exam: Vitals:   08/28/16 2156 08/29/16 0546  BP: 140/72 136/65  Pulse: 83 77  Resp:  20  Temp: 98 F (36.7 C) 98.6 F (37 C)   Vitals:   08/28/16 2114 08/28/16 2156 08/29/16 0546 08/29/16 1101  BP:  140/72 136/65   Pulse:  83 77   Resp:   20   Temp:  98 F (36.7 C) 98.6 F (37 C)   TempSrc:  Oral    SpO2: 99% 97% 98% 94%  Weight:      Height:       General: Pt is alert, awake, not in acute distress Cardiovascular: RRR, S1/S2 +, no rubs, no gallops Respiratory: CTA bilaterally, no wheezing, no rhonchi Abdominal: Soft, NT, ND, bowel sounds + Extremities: no edema, no cyanosis  The results of significant diagnostics from this hospitalization (including imaging, microbiology, ancillary and laboratory) are listed below for reference.     Microbiology: Recent Results (from the past 240 hour(s))  Respiratory Panel by PCR     Status: Abnormal   Collection Time: 08/28/16  6:12 PM  Result Value Ref Range Status    Adenovirus NOT DETECTED NOT DETECTED Final   Coronavirus 229E NOT DETECTED NOT DETECTED Final   Coronavirus HKU1 NOT DETECTED NOT DETECTED Final   Coronavirus NL63 NOT DETECTED NOT DETECTED Final   Coronavirus OC43 NOT DETECTED NOT DETECTED Final   Metapneumovirus NOT DETECTED NOT DETECTED Final   Rhinovirus / Enterovirus DETECTED (A) NOT DETECTED Final   Influenza A NOT DETECTED NOT DETECTED Final   Influenza B NOT DETECTED NOT DETECTED Final   Parainfluenza Virus 1 NOT DETECTED NOT DETECTED Final   Parainfluenza Virus 2 NOT DETECTED NOT DETECTED Final   Parainfluenza Virus 3 NOT DETECTED NOT DETECTED Final   Parainfluenza Virus 4 NOT DETECTED NOT DETECTED Final   Respiratory Syncytial Virus NOT DETECTED NOT DETECTED Final   Bordetella pertussis NOT DETECTED NOT DETECTED Final   Chlamydophila pneumoniae NOT DETECTED NOT DETECTED Final   Mycoplasma pneumoniae NOT DETECTED NOT DETECTED Final  Culture, blood (routine x 2) Call MD if unable to obtain prior to antibiotics being given     Status: None (Preliminary result)   Collection Time: 08/28/16  6:27 PM  Result Value Ref Range Status   Specimen Description BLOOD RIGHT ANTECUBITAL  Final   Special Requests   Final    BOTTLES DRAWN AEROBIC AND ANAEROBIC Blood Culture adequate volume   Culture NO GROWTH < 24 HOURS  Final   Report Status PENDING  Incomplete  Culture, blood (routine x 2) Call MD if unable to obtain prior to antibiotics being given     Status: None (Preliminary result)   Collection Time: 08/28/16  6:34 PM  Result Value Ref Range Status   Specimen Description BLOOD RIGHT WRIST  Final   Special Requests   Final    BOTTLES DRAWN AEROBIC AND ANAEROBIC Blood Culture adequate volume   Culture NO GROWTH < 24 HOURS  Final   Report Status PENDING  Incomplete  Culture, sputum-assessment     Status: None   Collection Time: 08/28/16  6:44 PM  Result Value Ref Range Status   Specimen Description EXPECTORATED SPUTUM  Final    Special Requests NONE  Final   Sputum evaluation THIS SPECIMEN IS ACCEPTABLE FOR SPUTUM CULTURE  Final   Report Status 08/29/2016 FINAL  Final  Culture, respiratory (NON-Expectorated)     Status: None (Preliminary result)   Collection Time: 08/28/16  6:44 PM  Result Value Ref Range Status   Specimen Description EXPECTORATED SPUTUM  Final   Special Requests NONE Reflexed from [id number]  Final   Gram Stain   Final    MODERATE WBC PRESENT, PREDOMINANTLY PMN ABUNDANT GRAM POSITIVE COCCI FEW GRAM POSITIVE  RODS FEW GRAM NEGATIVE RODS FEW GRAM VARIABLE ROD    Culture PENDING  Incomplete   Report Status PENDING  Incomplete     Labs: BNP (last 3 results)  Recent Labs  08/28/16 1553  BNP 23.5   Basic Metabolic Panel:  Recent Labs Lab 08/28/16 1331 08/29/16 0739  NA 137 140  K 3.9 3.9  CL 105 108  CO2 23 22  GLUCOSE 166* 120*  BUN 14 22*  CREATININE 1.11 1.17  CALCIUM 9.0 8.8*   Liver Function Tests:  Recent Labs Lab 08/28/16 1331 08/29/16 0739  AST 56* 25  ALT 66* 43  ALKPHOS 66 64  BILITOT 0.6 0.7  PROT 7.1 6.4*  ALBUMIN 3.0* 2.5*    Recent Labs Lab 08/28/16 1331  LIPASE 18   No results for input(s): AMMONIA in the last 168 hours. CBC:  Recent Labs Lab 08/28/16 1331 08/29/16 0739  WBC 13.6* 18.2*  NEUTROABS 11.8*  --   HGB 14.1 12.8*  HCT 40.8 37.9*  MCV 94.0 94.3  PLT 283 269   Cardiac Enzymes:  Recent Labs Lab 08/28/16 1813  CKTOTAL 80   BNP: Invalid input(s): POCBNP CBG: No results for input(s): GLUCAP in the last 168 hours. D-Dimer No results for input(s): DDIMER in the last 72 hours. Hgb A1c No results for input(s): HGBA1C in the last 72 hours. Lipid Profile No results for input(s): CHOL, HDL, LDLCALC, TRIG, CHOLHDL, LDLDIRECT in the last 72 hours. Thyroid function studies No results for input(s): TSH, T4TOTAL, T3FREE, THYROIDAB in the last 72 hours.  Invalid input(s): FREET3 Anemia work up No results for input(s): VITAMINB12,  FOLATE, FERRITIN, TIBC, IRON, RETICCTPCT in the last 72 hours. Urinalysis No results found for: COLORURINE, APPEARANCEUR, LABSPEC, PHURINE, GLUCOSEU, HGBUR, BILIRUBINUR, KETONESUR, PROTEINUR, UROBILINOGEN, NITRITE, LEUKOCYTESUR Sepsis Labs Invalid input(s): PROCALCITONIN,  WBC,  LACTICIDVEN Microbiology Recent Results (from the past 240 hour(s))  Respiratory Panel by PCR     Status: Abnormal   Collection Time: 08/28/16  6:12 PM  Result Value Ref Range Status   Adenovirus NOT DETECTED NOT DETECTED Final   Coronavirus 229E NOT DETECTED NOT DETECTED Final   Coronavirus HKU1 NOT DETECTED NOT DETECTED Final   Coronavirus NL63 NOT DETECTED NOT DETECTED Final   Coronavirus OC43 NOT DETECTED NOT DETECTED Final   Metapneumovirus NOT DETECTED NOT DETECTED Final   Rhinovirus / Enterovirus DETECTED (A) NOT DETECTED Final   Influenza A NOT DETECTED NOT DETECTED Final   Influenza B NOT DETECTED NOT DETECTED Final   Parainfluenza Virus 1 NOT DETECTED NOT DETECTED Final   Parainfluenza Virus 2 NOT DETECTED NOT DETECTED Final   Parainfluenza Virus 3 NOT DETECTED NOT DETECTED Final   Parainfluenza Virus 4 NOT DETECTED NOT DETECTED Final   Respiratory Syncytial Virus NOT DETECTED NOT DETECTED Final   Bordetella pertussis NOT DETECTED NOT DETECTED Final   Chlamydophila pneumoniae NOT DETECTED NOT DETECTED Final   Mycoplasma pneumoniae NOT DETECTED NOT DETECTED Final  Culture, blood (routine x 2) Call MD if unable to obtain prior to antibiotics being given     Status: None (Preliminary result)   Collection Time: 08/28/16  6:27 PM  Result Value Ref Range Status   Specimen Description BLOOD RIGHT ANTECUBITAL  Final   Special Requests   Final    BOTTLES DRAWN AEROBIC AND ANAEROBIC Blood Culture adequate volume   Culture NO GROWTH < 24 HOURS  Final   Report Status PENDING  Incomplete  Culture, blood (routine x 2) Call MD if unable  to obtain prior to antibiotics being given     Status: None (Preliminary  result)   Collection Time: 08/28/16  6:34 PM  Result Value Ref Range Status   Specimen Description BLOOD RIGHT WRIST  Final   Special Requests   Final    BOTTLES DRAWN AEROBIC AND ANAEROBIC Blood Culture adequate volume   Culture NO GROWTH < 24 HOURS  Final   Report Status PENDING  Incomplete  Culture, sputum-assessment     Status: None   Collection Time: 08/28/16  6:44 PM  Result Value Ref Range Status   Specimen Description EXPECTORATED SPUTUM  Final   Special Requests NONE  Final   Sputum evaluation THIS SPECIMEN IS ACCEPTABLE FOR SPUTUM CULTURE  Final   Report Status 08/29/2016 FINAL  Final  Culture, respiratory (NON-Expectorated)     Status: None (Preliminary result)   Collection Time: 08/28/16  6:44 PM  Result Value Ref Range Status   Specimen Description EXPECTORATED SPUTUM  Final   Special Requests NONE Reflexed from [id number]  Final   Gram Stain   Final    MODERATE WBC PRESENT, PREDOMINANTLY PMN ABUNDANT GRAM POSITIVE COCCI FEW GRAM POSITIVE RODS FEW GRAM NEGATIVE RODS FEW GRAM VARIABLE ROD    Culture PENDING  Incomplete   Report Status PENDING  Incomplete   Time coordinating discharge: 32 minutes  SIGNED:  Irwin Brakeman, MD  [facility] 08/29/2016, 11:44 AM Pager [phone]  If 7PM-7AM, please contact night-coverage [email or web address] Password [email or web address]

## 2016-08-29 NOTE — Discharge Instructions (Signed)
Community-Acquired Pneumonia, Adult Pneumonia is an infection of the lungs. One type of pneumonia can happen while a person is in a hospital. A different type can happen when a person is not in a hospital (community-acquired pneumonia). It is easy for this kind to spread from person to person. It can spread to you if you breathe near an infected person who coughs or sneezes. Some symptoms include:  A dry cough.  A wet (productive) cough.  Fever.  Sweating.  Chest pain. Follow these instructions at home:  Take over-the-counter and prescription medicines only as told by your doctor.  Only take cough medicine if you are losing sleep.  If you were prescribed an antibiotic medicine, take it as told by your doctor. Do not stop taking the antibiotic even if you start to feel better.  Sleep with your head and neck raised (elevated). You can do this by putting a few pillows under your head, or you can sleep in a recliner.  Do not use tobacco products. These include cigarettes, chewing tobacco, and e-cigarettes. If you need help quitting, ask your doctor.  Drink enough water to keep your pee (urine) clear or pale yellow. A shot (vaccine) can help prevent pneumonia. Shots are often suggested for:  People older than 72 years of age.  People older than 72 years of age:  Who are having cancer treatment.  Who have long-term (chronic) lung disease.  Who have problems with their body's defense system (immune system). You may also prevent pneumonia if you take these actions:  Get the flu (influenza) shot every year.  Go to the dentist as often as told.  Wash your hands often. If soap and water are not available, use hand sanitizer. Contact a doctor if:  You have a fever.  You lose sleep because your cough medicine does not help. Get help right away if:  You are short of breath and it gets worse.  You have more chest pain.  Your sickness gets worse. This is very serious if:  You  are an older adult.  Your body's defense system is weak.  You cough up blood. This information is not intended to replace advice given to you by your health care provider. Make sure you discuss any questions you have with your health care provider. Document Released: 09/26/2007 Document Revised: 09/15/2015 Document Reviewed: 08/04/2014 Elsevier Interactive Patient Education  2017 [place].   Acute Respiratory Distress Syndrome, Adult Acute respiratory distress syndrome is a life-threatening condition in which fluid collects in the lungs. This prevents the lungs from filling with air and passing oxygen into the blood. This can cause the lungs and other vital organs to fail. The condition usually develops following an infection, illness, surgery, or injury. What are the causes? This condition may be caused by:  An infection, such as sepsis or pneumonia.  A serious injury to the head or chest.  Severe bleeding from an injury.  A major surgery.  Breathing in harmful chemicals or smoke.  Blood transfusions.  A blood clot in the lungs.  Breathing in vomit (aspiration).  Near-drowning.  Inflammation of the pancreas (pancreatitis).  A drug overdose. What are the signs or symptoms? Sudden shortness of breath and rapid breathing are the main symptoms of this condition. Other symptoms may include:  A fast or irregular heartbeat.  Skin, lips, or fingernails that look blue (cyanosis).  Confusion.  Tiredness or loss of energy.  Chest pain, particularly while taking a breath.  Coughing.  Restlessness  or anxiety.  Fever. This is usually present if there is an underlying infection, such as pneumonia. How is this diagnosed? This condition is diagnosed based on:  Your symptoms.  Medical history.  A physical exam. During the exam, your health care provider will listen to your heart and check for crackling or wheezing sounds in your lungs. You may also have other tests  to confirm the diagnosis and measure how well your lungs are working. These may include:  Measuring the amount of oxygen in your blood. Your health care provider will use two methods to do this procedure:  A small device (pulse oximeter) that is placed on your finger, earlobe, or toe.  An arterial blood gas test. A sample of blood is taken from an artery and tested for oxygen levels.  Blood tests.  Chest X-rays or CT scans to look for fluid in the lungs.  Taking a sample of your sputum to test for infection.  Heart test, such as an echocardiogram or electrocardiogram. This is done to rule out any heart problems (such as heart failure) that may be causing your symptoms.  Bronchoscopy. During this test, a thin, flexible tube with a light is passed into the mouth or nose, down the windpipe, and into the lungs. How is this treated? Treatment depends on the cause of your condition. The goal is to support you while your lungs heal and the underlying cause is treated. Treatment may include:  Oxygen therapy. This may be done through:  A tube in your nose or a face mask.  A ventilator. This device helps move air into and out of your lungs through a breathing tube that is inserted into your mouth or nose.  Continuous positive airway pressure (CPAP). This treatment uses mild air pressure to keep the airways open. A mask or other device will be placed over your nose or mouth.  Tracheostomy. During this procedure, a small cut is made in your neck to create an opening to your windpipe. A breathing tube is placed directly into your windpipe. The breathing tube is connected to a ventilator. This is done if you have problems with your airway or if you need a ventilator for a long period of time.  Positioning you to lie on your stomach (prone position).  Medicines, such as:  Sedatives to help you relax.  Blood pressure medicines.  Antibiotics to treat infection.  Blood thinners to prevent blood  clots.  Diuretics to help prevent excess fluid.  Fluids and nutrients given through an IV tube.  Wearing compression stockings on your legs to prevent blood clots.  Extra corporeal membrane oxygenation (ECMO). This treatment takes blood outside your body, adds oxygen, and removes carbon dioxide. The blood is then returned to your body. This treatment is only used in severe cases. Follow these instructions at home:  Take over-the-counter and prescription medicines only as told by your health care provider.  Do not use any products that contain nicotine or tobacco, such as cigarettes and e-cigarettes. If you need help quitting, ask your health care provider.  Limit alcohol intake to no more than 1 drink per day for nonpregnant women and 2 drinks per day for men. One drink equals 12 oz of beer, 5 oz of wine, or 1 oz of hard liquor.  Ask friends and family to help you if daily activities make you tired.  Attend any pulmonary rehabilitation as told by your health care provider. This may include:  Education about your condition.  Exercises.  Breathing training.  Counseling.  Learning techniques to conserve energy.  Nutrition counseling.  Keep all follow-up visits as told by your health care provider. This is important. Contact a health care provider if:  You become short of breath during activity or while resting.  You develop a cough that does not go away.  You have a fever.  Your symptoms do not get better or they get worse.  You become anxious or depressed. Get help right away if:  You have sudden shortness of breath.  You develop sudden chest pain that does not go away.  You develop a rapid heart rate.  You develop swelling or pain in one of your legs.  You cough up blood.  You have trouble breathing.  Your skin, lips, or fingernails turn blue. These symptoms may represent a serious problem that is an emergency. Do not wait to see if the symptoms will go  away. Get medical help right away. Call your local emergency services (911 in the [place] Do not drive yourself to the hospital. Summary  Acute respiratory distress syndrome is a life-threatening condition in which fluid collects in the lungs, which leads the lungs and other vital organs to fail.  This condition usually develops following an infection, illness, surgery, or injury.  Sudden shortness of breath and rapid breathing are the main symptoms of acute respiratory distress syndrome.  Treatment may include oxygen therapy, continuous positive airway pressure (CPAP), tracheostomy, lying on your stomach (prone position), medicines, fluids and nutrients given through an IV tube, compression stockings, and extra corporeal membrane oxygenation (ECMO). This information is not intended to replace advice given to you by your health care provider. Make sure you discuss any questions you have with your health care provider. Document Released: 04/09/2005 Document Revised: 03/26/2016 Document Reviewed: 03/26/2016 Elsevier Interactive Patient Education  2017 [employer].

## 2016-08-31 LAB — CULTURE, RESPIRATORY: CULTURE: NORMAL

## 2016-08-31 LAB — CULTURE, RESPIRATORY W GRAM STAIN

## 2016-09-02 LAB — CULTURE, BLOOD (ROUTINE X 2)
CULTURE: NO GROWTH
Culture: NO GROWTH
SPECIAL REQUESTS: ADEQUATE
Special Requests: ADEQUATE

## 2016-09-03 ENCOUNTER — Ambulatory Visit (INDEPENDENT_AMBULATORY_CARE_PROVIDER_SITE_OTHER): Payer: Medicare Other | Admitting: Family Medicine

## 2016-09-03 ENCOUNTER — Encounter: Payer: Self-pay | Admitting: Family Medicine

## 2016-09-03 VITALS — BP 130/68 | HR 100 | Temp 98.7°F | Resp 22 | Ht 74.0 in | Wt 191.0 lb

## 2016-09-03 DIAGNOSIS — J181 Lobar pneumonia, unspecified organism: Secondary | ICD-10-CM | POA: Diagnosis not present

## 2016-09-03 DIAGNOSIS — J189 Pneumonia, unspecified organism: Secondary | ICD-10-CM

## 2016-09-03 NOTE — Progress Notes (Signed)
Subjective:    Patient ID: Brent Gonzales, male    DOB: 1946/01/29, 71 y.o.   MRN: [id number]  HPI Recently admitted to the hospital with one-week history of fever, muscle aches, coughing, and shortness of breath. CAT scan emergency room revealed left lower lobe pneumonia and left-sided effusion. He also had reactive lymphadenopathy in the abdomen and pelvis which was minor. Patient also had mild elevation in his liver function tests which were attributed to his illness. He was treated with a one-week course of Levaquin. He is admitted to hospital on May 8 and was discharged on May 9. He is here today for follow-up. Culture results have returned negative thus far. He feels much better. His chest pain is improving. His cough is improving. He has been afebrile for the last 5 days. He still have some mild residual left-sided discomfort. Past Medical History:  Diagnosis Date  . Aorto-iliac disease ([place])    mild bilateral external. ABIs in the 0.9 range  . Carotid artery disease ([place])    right ICA stenosis  . COPD (chronic obstructive pulmonary disease) ([place])   . Coronary artery disease   . High cholesterol   . Hyperlipidemia   . Hypertension   . Tobacco abuse    Past Surgical History:  Procedure Laterality Date  . DOPPLER ECHOCARDIOGRAPHY    . Duplex ultrasound    . HERNIA REPAIR     left and right  . NM MYOVIEW LTD  03/2010   Inferior septal ischemia  . stress myocardial perfusion    . TONSILLECTOMY AND ADENOIDECTOMY     Current Outpatient Prescriptions on File Prior to Visit  Medication Sig Dispense Refill  . acetaminophen (TYLENOL) 500 MG tablet Take 500 mg by mouth every 6 (six) hours as needed (for pain or headaches).    Marland Kitchen albuterol (PROVENTIL HFA;VENTOLIN HFA) 108 (90 Base) MCG/ACT inhaler Inhale 2 puffs into the lungs every 6 (six) hours as needed for wheezing or shortness of breath. 3 Inhaler 3  . aspirin 81 MG tablet Take 81 mg by mouth daily.    Marland Kitchen atorvastatin (LIPITOR) 40  MG tablet TAKE 1 TABLET (40 MG TOTAL) BY MOUTH DAILY. 360 tablet 0  . budesonide-formoterol (SYMBICORT) 160-4.5 MCG/ACT inhaler Take 2 puffs first thing in am and then another 2 puffs about 12 hours later. 1 Inhaler 0  . cetirizine (ZYRTEC) 10 MG tablet Take 10 mg by mouth daily as needed for allergies.    . Cholecalciferol (VITAMIN D) 2000 UNITS CAPS Take 2,000 Units by mouth daily.    . clopidogrel (PLAVIX) 75 MG tablet Take 1 tablet (75 mg total) by mouth daily with breakfast. 365 tablet 0  . Coenzyme Q10 (CO Q10) 100 MG CAPS Take 200 mg by mouth daily.    Marland Kitchen glucosamine-chondroitin 500-400 MG tablet Take 1 tablet by mouth 2 (two) times daily.    Marland Kitchen ibuprofen (ADVIL,MOTRIN) 200 MG tablet Take 400 mg by mouth every 6 (six) hours as needed (for pain or headaches).    Marland Kitchen levofloxacin (LEVAQUIN) 750 MG tablet Take 1 tablet (750 mg total) by mouth daily. 6 tablet 0  . Multiple Vitamin (MULTIVITAMIN) capsule Take 1 capsule by mouth daily.    . pantoprazole (PROTONIX) 40 MG tablet Take 1 tablet (40 mg total) by mouth daily. Take 30-60 min before first meal of the day 30 tablet 2   No current facility-administered medications on file prior to visit.    Allergies  Allergen Reactions  . Penicillins  Other (See Comments)    Family history if this allergy Has patient had a PCN reaction causing immediate rash, facial/tongue/throat swelling, SOB or lightheadedness with hypotension: No Has patient had a PCN reaction causing severe rash involving mucus membranes or skin necrosis: No Has patient had a PCN reaction that required hospitalization: No Has patient had a PCN reaction occurring within the last 10 years: No If all of the above answers are "NO", then may proceed with Cephalosporin use.    Social History   Social History  . Marital status: Married    Spouse name: N/A  . Number of children: N/A  . Years of education: N/A   Occupational History  . Not on file.   Social History Main Topics  .  Smoking status: Current Every Day Smoker    Packs/day: 0.50    Years: 45.00    Types: Cigarettes  . Smokeless tobacco: Never Used  . Alcohol use 0.6 oz/week    1 Cans of beer per week  . Drug use: No  . Sexual activity: Yes   Other Topics Concern  . Not on file   Social History Narrative  . No narrative on file      Review of Systems  All other systems reviewed and are negative.      Objective:   Physical Exam  Constitutional: He appears well-developed and well-nourished. No distress.  Neck: Neck supple.  Cardiovascular: Normal rate, regular rhythm and normal heart sounds.   No murmur heard. Pulmonary/Chest: Effort normal and breath sounds normal. No respiratory distress. He has no wheezes. He has no rales.  Abdominal: Soft. Bowel sounds are normal. He exhibits no distension and no mass. There is no tenderness. There is no rebound and no guarding.  Musculoskeletal: He exhibits no edema.  Lymphadenopathy:    He has no cervical adenopathy.  Skin: He is not diaphoretic.  Vitals reviewed.         Assessment & Plan:  Pneumonia of left lower lobe due to infectious organism ([place]) - Plan: CBC with Differential/Platelet, COMPLETE METABOLIC PANEL WITH GFR, DG Chest 2 View  Clinically, the patient is recovering well. I anticipate that he will be back to 100% by next week. I would like to repeat a chest x-ray in 4 weeks to ensure resolution. Meanwhile I'll follow-up on his transaminitis by repeating a CBC and a CMP today. Continue to encourage smoking cessation but at the present time, the patient has no desire for Chantix. Once liver function tests have improved, I would like the patient to resume his statin

## 2016-09-04 LAB — CBC WITH DIFFERENTIAL/PLATELET
Basophils Absolute: 0 cells/uL (ref 0–200)
Basophils Relative: 0 %
EOS ABS: 332 {cells}/uL (ref 15–500)
Eosinophils Relative: 2 %
HEMATOCRIT: 39.9 % (ref 38.5–50.0)
Hemoglobin: 12.8 g/dL — ABNORMAL LOW (ref 13.0–17.0)
LYMPHS PCT: 7 %
Lymphs Abs: 1162 cells/uL (ref 850–3900)
MCH: 31.5 pg (ref 27.0–33.0)
MCHC: 32.1 g/dL (ref 32.0–36.0)
MCV: 98.3 fL (ref 80.0–100.0)
MONO ABS: 996 {cells}/uL — AB (ref 200–950)
MPV: 10 fL (ref 7.5–12.5)
Monocytes Relative: 6 %
Neutro Abs: 14110 cells/uL — ABNORMAL HIGH (ref 1500–7800)
Neutrophils Relative %: 85 %
Platelets: 420 10*3/uL — ABNORMAL HIGH (ref 140–400)
RBC: 4.06 MIL/uL — AB (ref 4.20–5.80)
RDW: 13.4 % (ref 11.0–15.0)
WBC: 16.6 10*3/uL — AB (ref 3.8–10.8)

## 2016-09-04 LAB — COMPLETE METABOLIC PANEL WITH GFR
ALT: 69 U/L — ABNORMAL HIGH (ref 9–46)
AST: 40 U/L — ABNORMAL HIGH (ref 10–35)
Albumin: 2.8 g/dL — ABNORMAL LOW (ref 3.6–5.1)
Alkaline Phosphatase: 58 U/L (ref 40–115)
BUN: 19 mg/dL (ref 7–25)
CALCIUM: 8.4 mg/dL — AB (ref 8.6–10.3)
CHLORIDE: 107 mmol/L (ref 98–110)
CO2: 23 mmol/L (ref 20–31)
Creat: 1.22 mg/dL — ABNORMAL HIGH (ref 0.70–1.18)
GFR, EST AFRICAN AMERICAN: 69 mL/min (ref >=60)
GFR, Est Non African American: 59 mL/min — ABNORMAL LOW (ref >=60)
GLUCOSE: 122 mg/dL — AB (ref 70–99)
POTASSIUM: 4.5 mmol/L (ref 3.5–5.3)
Sodium: 139 mmol/L (ref 135–146)
Total Bilirubin: 0.3 mg/dL (ref 0.2–1.2)
Total Protein: 6.2 g/dL (ref 6.1–8.1)

## 2016-09-05 ENCOUNTER — Ambulatory Visit (INDEPENDENT_AMBULATORY_CARE_PROVIDER_SITE_OTHER): Payer: Medicare Other | Admitting: Cardiovascular Disease

## 2016-09-05 ENCOUNTER — Encounter: Payer: Self-pay | Admitting: Cardiovascular Disease

## 2016-09-05 VITALS — BP 112/70 | HR 96 | Ht 74.0 in | Wt 191.2 lb

## 2016-09-05 DIAGNOSIS — E7800 Pure hypercholesterolemia, unspecified: Secondary | ICD-10-CM

## 2016-09-05 DIAGNOSIS — I739 Peripheral vascular disease, unspecified: Secondary | ICD-10-CM | POA: Diagnosis not present

## 2016-09-05 DIAGNOSIS — I10 Essential (primary) hypertension: Secondary | ICD-10-CM | POA: Diagnosis not present

## 2016-09-05 DIAGNOSIS — F17210 Nicotine dependence, cigarettes, uncomplicated: Secondary | ICD-10-CM

## 2016-09-05 DIAGNOSIS — I779 Disorder of arteries and arterioles, unspecified: Secondary | ICD-10-CM | POA: Diagnosis not present

## 2016-09-05 NOTE — Progress Notes (Signed)
09/05/2016 Brent Gonzales   [birth year]  [id number]  Primary Physician Pickard, Cammie Mcgee, MD Primary Cardiologist: Lorretta Harp MD Renae Gloss  HPI:  The patient is a 71 year old, thin-appearing, married Caucasian male father to 3 stepchildren who I last saw 09/02/15 He has a history of discontinued tobacco abuse, treated hypertension and hyperlipidemia. He has moderate carotid disease with a right ICA stenosis that has remained stable by ultrasound as recently as 03/10/14.Marland Kitchen He also has mild bilateral external iliac disease by duplex ultrasound with normal ABIs, though, he denies claudication but does complain of some left hip pain.. Myoview performed December 2011 showed inferior septal ischemia that we decided to treat conservatively since he is asymptomatic. Since I saw him a year ago he developed pneumonia approximately 2 weeks ago and was seen in the emergency room and treated with antibiotics. He continues to complain of shortness of breath slowly improving.  Current Outpatient Prescriptions  Medication Sig Dispense Refill  . acetaminophen (TYLENOL) 500 MG tablet Take 500 mg by mouth every 6 (six) hours as needed (for pain or headaches).    Marland Kitchen albuterol (PROVENTIL HFA;VENTOLIN HFA) 108 (90 Base) MCG/ACT inhaler Inhale 2 puffs into the lungs every 6 (six) hours as needed for wheezing or shortness of breath. 3 Inhaler 3  . aspirin 81 MG tablet Take 81 mg by mouth daily.    Marland Kitchen atorvastatin (LIPITOR) 40 MG tablet TAKE 1 TABLET (40 MG TOTAL) BY MOUTH DAILY. 360 tablet 0  . budesonide-formoterol (SYMBICORT) 160-4.5 MCG/ACT inhaler Take 2 puffs first thing in am and then another 2 puffs about 12 hours later. 1 Inhaler 0  . cetirizine (ZYRTEC) 10 MG tablet Take 10 mg by mouth daily as needed for allergies.    . Cholecalciferol (VITAMIN D) 2000 UNITS CAPS Take 2,000 Units by mouth daily.    . clopidogrel (PLAVIX) 75 MG tablet Take 1 tablet (75 mg total) by mouth daily with  breakfast. 365 tablet 0  . Coenzyme Q10 (CO Q10) 100 MG CAPS Take 200 mg by mouth daily.    Marland Kitchen glucosamine-chondroitin 500-400 MG tablet Take 1 tablet by mouth 2 (two) times daily.    Marland Kitchen ibuprofen (ADVIL,MOTRIN) 200 MG tablet Take 400 mg by mouth every 6 (six) hours as needed (for pain or headaches).    . Multiple Vitamin (MULTIVITAMIN) capsule Take 1 capsule by mouth daily.    . pantoprazole (PROTONIX) 40 MG tablet Take 1 tablet (40 mg total) by mouth daily. Take 30-60 min before first meal of the day 30 tablet 2   No current facility-administered medications for this visit.     Allergies  Allergen Reactions  . Penicillins Other (See Comments)    Family history if this allergy Has patient had a PCN reaction causing immediate rash, facial/tongue/throat swelling, SOB or lightheadedness with hypotension: No Has patient had a PCN reaction causing severe rash involving mucus membranes or skin necrosis: No Has patient had a PCN reaction that required hospitalization: No Has patient had a PCN reaction occurring within the last 10 years: No If all of the above answers are "NO", then may proceed with Cephalosporin use.     Social History   Social History  . Marital status: Married    Spouse name: N/A  . Number of children: N/A  . Years of education: N/A   Occupational History  . Not on file.   Social History Main Topics  . Smoking status: Current Every Day Smoker  Packs/day: 0.50    Years: 45.00    Types: Cigarettes  . Smokeless tobacco: Never Used  . Alcohol use 0.6 oz/week    1 Cans of beer per week  . Drug use: No  . Sexual activity: Yes   Other Topics Concern  . Not on file   Social History Narrative  . No narrative on file     Review of Systems: General: negative for chills, fever, night sweats or weight changes.  Cardiovascular: negative for chest pain, dyspnea on exertion, edema, orthopnea, palpitations, paroxysmal nocturnal dyspnea or shortness of  breath Dermatological: negative for rash Respiratory: negative for cough or wheezing Urologic: negative for hematuria Abdominal: negative for nausea, vomiting, diarrhea, bright red blood per rectum, melena, or hematemesis Neurologic: negative for visual changes, syncope, or dizziness All other systems reviewed and are otherwise negative except as noted above.    Blood pressure 112/70, pulse 96, height 6\' 2"  (1.88 m), weight 191 lb 3.2 oz (86.7 kg), SpO2 92 %.  General appearance: alert and no distress Neck: no adenopathy, no JVD, supple, symmetrical, trachea midline, thyroid not enlarged, symmetric, no tenderness/mass/nodules and Loud bilateral carotid bruits Lungs: clear to auscultation bilaterally Heart: regular rate and rhythm, S1, S2 normal, no murmur, click, rub or gallop Extremities: Mild erythema of his feet and mild swelling on the dorsum of the feet bilaterally.  EKG not performed today  ASSESSMENT AND PLAN:   Essential hypertension History of essential hypertension blood pressure is 112/70. He is not  Hyperlipidemia History of hyperlipidemia on statin therapy in the past helped by the emergency room recently because of increased serum transaminases.  Cigarette smoker History of recently discontinued tobacco abuse  Carotid artery disease History of mild to moderate bilateral ICA stenosis by duplex ultrasound 04/08/15. This will be checked again in December of this year.  Peripheral arterial disease History of peripheral arterial disease with Dopplers performed 04/08/15 revealing moderate bilateral iliac disease. The patient really denies claudication.  HTN (hypertension) History of essential hypertension blood pressure 110 112/70. He is currently not on antihypertensive medications      Lorretta Harp MD [facility], [facility] 09/05/2016 5:00 PM

## 2016-09-05 NOTE — Assessment & Plan Note (Signed)
History of recently discontinued tobacco abuse 

## 2016-09-05 NOTE — Assessment & Plan Note (Signed)
History of essential hypertension blood pressure 110 112/70. He is currently not on antihypertensive medications

## 2016-09-05 NOTE — Patient Instructions (Signed)
Medication Instructions: Your physician recommends that you continue on your current medications as directed. Please refer to the Current Medication list given to you today.   Testing/Procedures: Your physician has requested that you have a carotid duplex. This test is an ultrasound of the carotid arteries in your neck. It looks at blood flow through these arteries that supply the brain with blood. Allow one hour for this exam. There are no restrictions or special instructions.  Your physician has requested that you have a lower extremity arterial duplex. During this test, ultrasound is used to evaluate arterial blood flow in the legs. Allow one hour for this exam. There are no restrictions or special instructions.  Your physician has requested that you have an ankle brachial index (ABI). During this test an ultrasound and blood pressure cuff are used to evaluate the arteries that supply the arms and legs with blood. Allow thirty minutes for this exam. There are no restrictions or special instructions. (In December 2018)  Follow-Up: Your physician wants you to follow-up in: 1 year with Dr. Gwenlyn Found. You will receive a reminder letter in the mail two months in advance. If you don't receive a letter, please call our office to schedule the follow-up appointment.  If you need a refill on your cardiac medications before your next appointment, please call your pharmacy.

## 2016-09-05 NOTE — Assessment & Plan Note (Signed)
History of hyperlipidemia on statin therapy in the past helped by the emergency room recently because of increased serum transaminases.

## 2016-09-05 NOTE — Assessment & Plan Note (Signed)
History of peripheral arterial disease with Dopplers performed 04/08/15 revealing moderate bilateral iliac disease. The patient really denies claudication.

## 2016-09-05 NOTE — Assessment & Plan Note (Signed)
History of mild to moderate bilateral ICA stenosis by duplex ultrasound 04/08/15. This will be checked again in December of this year.

## 2016-09-05 NOTE — Assessment & Plan Note (Signed)
History of essential hypertension blood pressure is 112/70. He is not

## 2016-09-06 ENCOUNTER — Other Ambulatory Visit: Payer: Self-pay | Admitting: Family Medicine

## 2016-09-06 DIAGNOSIS — R945 Abnormal results of liver function studies: Secondary | ICD-10-CM

## 2016-09-06 DIAGNOSIS — R7989 Other specified abnormal findings of blood chemistry: Secondary | ICD-10-CM

## 2016-09-06 DIAGNOSIS — D72828 Other elevated white blood cell count: Secondary | ICD-10-CM

## 2016-09-07 ENCOUNTER — Ambulatory Visit (HOSPITAL_COMMUNITY)
Admission: RE | Admit: 2016-09-07 | Discharge: 2016-09-07 | Disposition: A | Discharge status: Home / Self Care | Payer: Medicare Other | Source: Ambulatory Visit | Attending: Internal Medicine | Admitting: Internal Medicine

## 2016-09-07 ENCOUNTER — Ambulatory Visit (INDEPENDENT_AMBULATORY_CARE_PROVIDER_SITE_OTHER)
Admission: RE | Admit: 2016-09-07 | Discharge: 2016-09-07 | Disposition: A | Discharge status: Home / Self Care | Payer: Medicare Other | Source: Ambulatory Visit | Attending: Internal Medicine | Admitting: Internal Medicine

## 2016-09-07 ENCOUNTER — Other Ambulatory Visit (INDEPENDENT_AMBULATORY_CARE_PROVIDER_SITE_OTHER): Payer: Medicare Other

## 2016-09-07 ENCOUNTER — Ambulatory Visit (HOSPITAL_COMMUNITY)
Admission: RE | Admit: 2016-09-07 | Discharge: 2016-09-07 | Disposition: A | Discharge status: Home / Self Care | Payer: Medicare Other | Source: Ambulatory Visit | Attending: Physician Assistant | Admitting: Physician Assistant

## 2016-09-07 ENCOUNTER — Encounter: Payer: Self-pay | Admitting: Internal Medicine

## 2016-09-07 ENCOUNTER — Ambulatory Visit (INDEPENDENT_AMBULATORY_CARE_PROVIDER_SITE_OTHER): Payer: Medicare Other | Admitting: Internal Medicine

## 2016-09-07 VITALS — BP 118/70 | HR 85 | Temp 98.2°F | Ht 74.0 in | Wt 191.0 lb

## 2016-09-07 DIAGNOSIS — J189 Pneumonia, unspecified organism: Secondary | ICD-10-CM | POA: Diagnosis not present

## 2016-09-07 DIAGNOSIS — J181 Lobar pneumonia, unspecified organism: Secondary | ICD-10-CM

## 2016-09-07 DIAGNOSIS — J449 Chronic obstructive pulmonary disease, unspecified: Secondary | ICD-10-CM

## 2016-09-07 DIAGNOSIS — R0602 Shortness of breath: Secondary | ICD-10-CM | POA: Diagnosis not present

## 2016-09-07 DIAGNOSIS — J918 Pleural effusion in other conditions classified elsewhere: Secondary | ICD-10-CM

## 2016-09-07 DIAGNOSIS — J948 Other specified pleural conditions: Secondary | ICD-10-CM | POA: Diagnosis not present

## 2016-09-07 DIAGNOSIS — J90 Pleural effusion, not elsewhere classified: Secondary | ICD-10-CM | POA: Diagnosis not present

## 2016-09-07 LAB — PROTEIN, PLEURAL OR PERITONEAL FLUID: Total protein, fluid: 4.1 g/dL

## 2016-09-07 LAB — CBC WITH DIFFERENTIAL/PLATELET
BASOS ABS: 0.1 10*3/uL (ref 0.0–0.1)
BASOS PCT: 0.9 % (ref 0.0–3.0)
EOS ABS: 0.2 10*3/uL (ref 0.0–0.7)
Eosinophils Relative: 1.9 % (ref 0.0–5.0)
HCT: 37.8 % — ABNORMAL LOW (ref 39.0–52.0)
HEMOGLOBIN: 12.5 g/dL — AB (ref 13.0–17.0)
LYMPHS PCT: 8.8 % — AB (ref 12.0–46.0)
Lymphs Abs: 1.1 10*3/uL (ref 0.7–4.0)
MCHC: 33.2 g/dL (ref 30.0–36.0)
MCV: 96 fl (ref 78.0–100.0)
Monocytes Absolute: 1 10*3/uL (ref 0.1–1.0)
Monocytes Relative: 7.6 % (ref 3.0–12.0)
NEUTROS PCT: 80.8 % — AB (ref 43.0–77.0)
Neutro Abs: 10.4 10*3/uL — ABNORMAL HIGH (ref 1.4–7.7)
Platelets: 519 10*3/uL — ABNORMAL HIGH (ref 150.0–400.0)
RBC: 3.93 Mil/uL — ABNORMAL LOW (ref 4.22–5.81)
RDW: 13.6 % (ref 11.5–15.5)
WBC: 12.9 10*3/uL — AB (ref 4.0–10.5)

## 2016-09-07 LAB — LACTATE DEHYDROGENASE, PLEURAL OR PERITONEAL FLUID: LD FL: 319 U/L — AB (ref 3–23)

## 2016-09-07 LAB — SEDIMENTATION RATE: SED RATE: 129 mm/h — AB (ref 0–20)

## 2016-09-07 LAB — GLUCOSE, PLEURAL OR PERITONEAL FLUID: GLUCOSE FL: 98 mg/dL

## 2016-09-07 LAB — BODY FLUID CELL COUNT WITH DIFFERENTIAL
Lymphs, Fluid: 45 %
Monocyte-Macrophage-Serous Fluid: 25 % — ABNORMAL LOW (ref 50–90)
NEUTROPHIL FLUID: 30 % — AB (ref 0–25)
Total Nucleated Cell Count, Fluid: 300 cu mm (ref 0–1000)

## 2016-09-07 MED ORDER — BUDESONIDE-FORMOTEROL FUMARATE 160-4.5 MCG/ACT IN AERO: 2.0000 | INHALATION_SPRAY | Freq: Two times a day (BID) | RESPIRATORY_TRACT | 0 refills | Status: DC

## 2016-09-07 NOTE — Assessment & Plan Note (Addendum)
Quit smoking 05/2016 - Spirometry 06/21/2016  FEV1 1.95 (52%)  Ratio 66 p anoro and saba   -  06/21/2016    try symbicort 160 2bid  - PFT's  07/19/2016  FEV1 2.16 (58 % ) ratio 65  p 2 % improvement from saba p symb 160 prior to study with DLCO  43 % corrects to 65  % for alv volume - Sinus CT 07/21/16 Mild-to-moderate mucosal thickening of the right maxillary sinus  - 09/07/2016  After extensive coaching HFA effectiveness =   90% from a baseline of 75%   No change in rx needed = symbicort 160 2bid and prn saba

## 2016-09-07 NOTE — Progress Notes (Signed)
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 Pulmonary Consult    Dr. Dennard Schaumann referred him here, he was dx'd with COPD back in the fall, been taking Anoro and Pro Air, a cold caused him constant drainage with coughing and sneezing with SOB, he end up in the hospital, stopped smoking 2 weeks ago  about 50 % better since in ER but still with sense of throat and chest congestion more day than noct assoc with doe = MMRC2 = can't walk a nl pace on a flat grade s sob but does fine slow and flat eg shopping ok rec Plan A = Automatic = Symbicort 160 Take 2 puffs first thing in am and then another 2 puffs about 12 hours later.  Plan B = Backup Only use your albuterol as a rescue medication  Pantoprazole (protonix) 40 mg   Take  30-60 min before first meal of the day and Pepcid (famotidine)  20 mg one @  bedtime  GERD  Please schedule a follow up office visit in 4 weeks, sooner if needed with pfts     07/19/2016  f/u ov/Brent Gonzales re: copd II on symb 160 2 bid / gerd dr  Chief Complaint  Patient presents with  . Follow-up    He has been coughing less but having chest tightness. He uses his albuterol inhaler 2-3 x per day. Started smoking again daily.   overall better despite smoking and reported chest tightness that resolves immediately with albuterol  - cough is assoc with nasal congestion and sense of pnds  rec Please see patient coordinator  before you leave today to schedule sinus CT  No change in medications The key is to stop smoking completely before smoking completely stops you - it's not too late as you only have mild copd  Please schedule a follow up visit in 3 months but call sooner if needed  with all medications /inhalers/ solutions in hand so we can verify exactly what you are taking. This includes all medications from all doctors and over the counters     Admit date: 08/28/2016 Discharge date: 08/29/2016   Brief/Interim Summary: HPI: Brent Pileggi Guthrieis a 71 y.o.malewith medical history significant for CAD and other vascular disease, COPD followed by Dr. Melvyn Novas as an outpatient,continued tobacco abuse, dyslipidemia and hypertension. Patient reports 1 week ago he developed generalized malaise with pleuritic type pain just below the left rib cage radiating to the left shoulder. This was associated with mild nonproductive cough. He also felt like because of the pain he could not get in a good deep breath. He had a MAXIMUM TEMPERATURE of 102F last week. He has been very fatigued. He has had adequate oral intake. He has not had nausea vomiting or diarrhea. No sick contacts. He is been utilizing Tylenol and ibuprofen  for the symptoms without improvement.  ED Course: Vital Signs: BP 123/75  Pulse 92  Temp 98.7 F (37.1 C) (Oral)  Resp (!) 24  SpO2 91%  2 view CXR:NEW LEFTlower lobe atelectasis/pneumonia with small left pleural effusion and underlying COPD CT abdomen and pelvis with contrast:Left lower lobe pneumonia with atelectasis and small effusion, mild right lower lobe atelectasis and possible pneumonia, mild upper abdominal adenopathy likely reactive in the absence of visible neoplasm Lab data: Sodium 137, potassium 3.9, chloride 105, CO2 23, glucose 166, BUN 14, creatinine 1.11, albumin 3, AST 56, ALT 66, lactic acid 0.78, white count 13,600 with neutrophils 87% and absolute neutrophils 1.8%, hemoglobin 14.1,  platelets 283,000 Medications and treatments:Fentanyl 50 g IV 1, Solu-Medrol 125 mg IV 1, Levaquin 750 mg IV 1  Acute respiratory failure with hypoxia 2/2CAP (community acquired pneumonia) -Presents with pleuritic chest pain with generalized malaise fevers and shortness of breath 1 week with radiographic evidence of bilateral pneumonia-also leukocytosis -Treat as community-acquired pneumonia:Levaquin -final blood cultures and sputum culture pending  -Respiratory viral panel positive for rhinovirus/enterovirus -Supportive care with oxygen-wean as tolerated-may benefit from short-term home O2 to decrease length of stay, will evaluate for home oxygen prior to discharge -IV Toradol was ordered for pleuritic chest discomfort (continue PPI) -Given IV fluids at 75 per hour-also received IV contrast for CT  Transaminitis -Likely secondary to acute illness -Held statin -Repeat LFTs within normal limits  COPD (chronic obstructive pulmonary disease)  -Not actively wheezing and does not appear to be expressing acute exacerbation -Follow up with pulmonologist after discharge -Continue home Symbicort and albuterol MDI  HTN (hypertension) -Moderately controlled -Not on antihypertensive therapy prior to admission, follow up with PCP  HLD (hyperlipidemia) -Hold statin in setting of mild transaminitis -CK normal  CAD (coronary artery disease) -Currently chest pain not consistent with prior ischemic pain-patient instructed to notify staff if develops typical ischemic pain -Continue preadmission low dose aspirin -Statin on hold as above -Continue Plavix -Was not on beta blocker prior to admission  DVT prophylaxis:Lovenox Code Status:Full Family Communication:Wife Disposition Plan:Home  Discharge Diagnoses:  Principal Problem:   CAP (community acquired pneumonia) Active Problems:   Acute respiratory failure with hypoxia (HCC)   COPD (chronic obstructive  pulmonary disease) (HCC)   HTN (hypertension)   HLD (hyperlipidemia)   CAD (coronary artery disease)   Transaminitis   Lobar pneumonia ([place])    09/07/2016  Acute extended  ov/Brent Gonzales re: transition of care/ post hosp ov/ sob getting worse  Chief Complaint  Patient presents with  . Acute Visit    Pt states recent admit to hospital for PNA 08/28/16 to 08/29/16. He states the pain he had been having is almost gone, but his breathing has been worse for the past 2-3 days. He is not coughing much.   had been improving but several days prior to [facility] onset gradually  worse doe and also orthopnea but no cp or purulent sputum and doe = MMRC3 = can't walk 100 yards even at a slow pace at a flat grade s stopping due to sob    No obvious day to day or daytime variability or assoc   mucus plugs or hemoptysis or cp or chest tightness, subjective wheeze or overt sinus or hb symptoms. No unusual exp hx or h/o childhood pna/ asthma or knowledge of premature birth.  Sleeping ok but only at 45 degrees  without nocturnal  or early am exacerbation  of respiratory  c/o's or need for  noct saba. Also denies any obvious fluctuation of symptoms with weather or environmental changes or other aggravating or alleviating factors except as outlined above   Current Medications, Allergies, Complete Past Medical History, Past Surgical History, Family History, and Social History were reviewed in [employer] record.  ROS  The following are not active complaints unless bolded sore throat, dysphagia, dental problems, itching, sneezing,  nasal congestion or excess/ purulent secretions, ear ache,   fever, chills, sweats, unintended wt loss, classically pleuritic or exertional cp,  orthopnea pnd or leg swelling, presyncope, palpitations, abdominal pain, anorexia, nausea, vomiting, diarrhea  or change in bowel or bladder habits, change in stools or urine, dysuria,hematuria,  rash, arthralgias, visual complaints,  headache, numbness, weakness or ataxia or problems with walking or coordination,  change in mood/affect or memory.                 Objective:   Physical Exam  amb wm nad depressed mood but appears comfortable sitting position   09/07/2016        191   07/19/2016       189   06/21/16 186 lb (84.4 kg)  06/13/16 180 lb (81.6 kg)  09/08/15 188 lb (85.3 kg)    Vital signs reviewed  - Note on arrival 02 sats  93% on RA     HEENT: nl dentition,  , and oropharynx. Nl external ear canals without cough reflex - mild  bilateral non-specific turbinate edema    NECK :  without JVD/Nodes/TM/ nl carotid upstrokes bilaterally   LUNGS: no acc muscle use,   Mild insp and exp rhonchi with classic BV changes L base and dulless L base to percussion   CV:  RRR  no s3 or murmur or increase in P2, and no edema   ABD:  soft and nontender with nl inspiratory excursion in the supine position. No bruits or organomegaly appreciated, bowel sounds nl  MS:  Nl gait/ ext warm without deformities, calf tenderness, cyanosis -  Moderate bilateral clubbing No obvious joint restrictions   SKIN: warm and dry without lesions    NEURO:  alert,   nl sensorium with  no motor or cerebellar deficits apparent.       CXR PA and Lateral:   09/07/2016 :    I personally reviewed images and agree with radiology impression as follows:    Increasing moderate to large left pleural effusion and/or mid-lower left lung consolidation.  Labs ordered 09/07/2016   Lab Results  Component Value Date   WBC 12.9 (H) 09/07/2016   HGB 12.5 (L) 09/07/2016   HCT 37.8 (L) 09/07/2016   MCV 96.0 09/07/2016   PLT 519.0 (H) 09/07/2016       Eos                        0.2       09/07/2016     Lab Results  Component Value Date   ESRSEDRATE 129 (H) 09/07/2016        Assessment & Plan:

## 2016-09-07 NOTE — Patient Instructions (Signed)
Please remember to go to the lab and x-ray department downstairs in the basement  for your tests - we will call you with the results when they are available.    Work on inhaler technique:  relax and gently blow all the way out then take a nice smooth deep breath back in, triggering the inhaler at same time you start breathing in.  Hold for up to 5 seconds if you can. Blow out thru nose. Rinse and gargle with water when done    Will arrange follow up after look at labs and cxr

## 2016-09-07 NOTE — Procedures (Signed)
PROCEDURE SUMMARY:  Successful US guided left thoracentesis. Yielded 100 mL of clear yellow fluid. Fluid was loculated. Pt tolerated procedure well. No immediate complications.  Specimen was sent for labs. CXR ordered.  Everleigh Colclasure S Charlotta Lapaglia PA-C 09/07/2016 2:19 PM

## 2016-09-07 NOTE — Assessment & Plan Note (Addendum)
See admit 08/28/16 - Late parapneumonic effusion 09/07/2016 >  See sep a/p   No further abx needed as this does not appear to be active pna or empyema

## 2016-09-07 NOTE — Progress Notes (Signed)
Spoke with pt and notified of results per Dr. Wert. Pt verbalized understanding and denied any questions. 

## 2016-09-08 ENCOUNTER — Encounter: Payer: Self-pay | Admitting: Internal Medicine

## 2016-09-08 DIAGNOSIS — J918 Pleural effusion in other conditions classified elsewhere: Secondary | ICD-10-CM

## 2016-09-08 DIAGNOSIS — J189 Pneumonia, unspecified organism: Secondary | ICD-10-CM | POA: Insufficient documentation

## 2016-09-08 NOTE — Assessment & Plan Note (Signed)
See admit 08/28/16 - Late parapneumonic effusion 09/07/2016 with ESR  129  > tap  09/07/2016 x 100 cc only,  Wbc 300 with L > P exudate / nl glucose > refer to T surgery 09/10/17   Clearly this is a complex and quite severe parapneumonic process which as organized and best approached with vats in what appears to be an operative candidate despite underlying copd  Discussed in detail all the  indications, usual  risks and alternatives  relative to the benefits with patient who agrees to proceed with w/u and rx as outlined  I had an extended discussion with the patient reviewing all relevant studies completed to date and  lasting 25 minutes of a 40  minute transition of care office visit addressing new severe non-specific but potentially very serious refractory respiratory symptoms of uncertain and potentially multiple  etiologies.   Each maintenance medication was reviewed in detail including most importantly the difference between maintenance and prns and under what circumstances the prns are to be triggered using an action plan format that is not reflected in the computer generated alphabetically organized AVS.    Please see AVS for specific instructions unique to this office visit that I personally wrote and verbalized to the the pt in detail and then reviewed with pt  by my nurse highlighting any changes in therapy/plan of care  recommended at today's visit.

## 2016-09-10 ENCOUNTER — Telehealth: Payer: Self-pay | Admitting: Internal Medicine

## 2016-09-10 ENCOUNTER — Other Ambulatory Visit: Payer: Self-pay | Admitting: Internal Medicine

## 2016-09-10 ENCOUNTER — Other Ambulatory Visit: Payer: Self-pay | Admitting: Family Medicine

## 2016-09-10 DIAGNOSIS — J189 Pneumonia, unspecified organism: Secondary | ICD-10-CM

## 2016-09-10 DIAGNOSIS — J918 Pleural effusion in other conditions classified elsewhere: Principal | ICD-10-CM

## 2016-09-10 NOTE — Telephone Encounter (Signed)
Called pt told him we had put in the order and sent it to [facility] and they would be calling him

## 2016-09-10 NOTE — Progress Notes (Signed)
Spoke with pt and notified of results per Dr. Wert. Pt verbalized understanding and denied any questions. 

## 2016-09-11 ENCOUNTER — Other Ambulatory Visit: Payer: Self-pay | Admitting: *Deleted

## 2016-09-11 DIAGNOSIS — J90 Pleural effusion, not elsewhere classified: Secondary | ICD-10-CM

## 2016-09-11 LAB — BODY FLUID CULTURE: CULTURE: NO GROWTH

## 2016-09-12 ENCOUNTER — Ambulatory Visit (HOSPITAL_COMMUNITY): Payer: Medicare Other

## 2016-09-13 ENCOUNTER — Institutional Professional Consult (permissible substitution) (INDEPENDENT_AMBULATORY_CARE_PROVIDER_SITE_OTHER): Payer: Medicare Other | Admitting: Cardiothoracic Surgery

## 2016-09-13 ENCOUNTER — Other Ambulatory Visit: Payer: Medicare Other

## 2016-09-13 ENCOUNTER — Ambulatory Visit
Admission: RE | Admit: 2016-09-13 | Discharge: 2016-09-13 | Disposition: A | Discharge status: Home / Self Care | Payer: Medicare Other | Source: Ambulatory Visit | Attending: Cardiothoracic Surgery | Admitting: Cardiothoracic Surgery

## 2016-09-13 ENCOUNTER — Other Ambulatory Visit: Payer: Self-pay | Admitting: Internal Medicine

## 2016-09-13 ENCOUNTER — Other Ambulatory Visit: Payer: Self-pay | Admitting: *Deleted

## 2016-09-13 ENCOUNTER — Encounter: Payer: Self-pay | Admitting: Cardiothoracic Surgery

## 2016-09-13 VITALS — BP 140/70 | HR 90 | Resp 20 | Ht 74.0 in | Wt 191.0 lb

## 2016-09-13 DIAGNOSIS — J90 Pleural effusion, not elsewhere classified: Secondary | ICD-10-CM

## 2016-09-13 DIAGNOSIS — J189 Pneumonia, unspecified organism: Secondary | ICD-10-CM

## 2016-09-13 DIAGNOSIS — Z9889 Other specified postprocedural states: Secondary | ICD-10-CM | POA: Diagnosis not present

## 2016-09-13 DIAGNOSIS — J918 Pleural effusion in other conditions classified elsewhere: Principal | ICD-10-CM

## 2016-09-13 NOTE — Progress Notes (Signed)
[address]       [place] [address]             [phone]                    C[facility]ain L [facility] Medical Record #[id number] Date of Birth: March 27, 1946  Referring: Tanda Rockers, MD Primary Care: [name], MD  Chief Complaint:    Chief Complaint  Patient presents with  . Pleural Effusion    eval on Left Pleural Effusion with CXR s/p Thoracentesis 09/07/16    History of Present Illness:    Brent Gonzales 71 y.o. male is seen in the office  Today. Patient is followed in the pulmonary office for long-term smoking and COPD, quit smoking  February 2018.  He noted onset of fever chills and cough and left pleuritic chest pain. He was diagnosed with community-acquired pneumonia, a CT scan of the abdomen was performed that showed left lower lobe consolidation and a small effusion. He notes that a course of a week of by mouth antibiotics his symptoms have improved. Over the second last several days his pleuritic pain is better. Attempted thoracentesis only removed 100 mL of straw-colored fluid on 5/ 18. The patient is referred from the pulmonary office for consideration of left VATS. The patient denies any occupational asbestos exposure that he is aware of, worked primarily as a Ecologist.   Current Activity/ Functional Status:  Patient is independent with mobility/ambulation, transfers, ADL's, IADL's.   Zubrod Score: At the time of surgery this patient's most appropriate activity status/level should be described as: []     0    Normal activity, no symptoms [x]     1    Restricted in physical strenuous activity but ambulatory, able to do out light work []     2    Ambulatory and capable of self care, unable to do work activities, up and about               >50 % of waking hours                              []     3    Only limited self care, in bed greater than 50% of waking hours []     4    Completely disabled, no self care, confined to bed  or chair []     5    Moribund   Past Medical History:  Diagnosis Date  . Aorto-iliac disease ([place])    mild bilateral external. ABIs in the 0.9 range  . Carotid artery disease ([place])    right ICA stenosis  . COPD (chronic obstructive pulmonary disease) ([place])   . Coronary artery disease   . High cholesterol   . Hyperlipidemia   . Hypertension   . Tobacco abuse     Past Surgical History:  Procedure Laterality Date  . DOPPLER ECHOCARDIOGRAPHY    . Duplex ultrasound    . HERNIA REPAIR     left and right  . NM MYOVIEW LTD  03/2010   Inferior septal ischemia  . stress myocardial perfusion    . TONSILLECTOMY AND ADENOIDECTOMY      History reviewed. No pertinent family history.  Social History   Social History  . Marital status: Married    Spouse name: N/A  . Number of children: N/A  . Years of  education: N/A   Occupational History  . 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 Smoking status: Former Smoker    Packs/day: 0.50    Years: 45.00    Quit date: 08/21/2016  . Smokeless tobacco: Never Used  . Alcohol use 0.6 oz/week    1 Cans of beer per week  . Drug use: No  . Sexual activity: Yes   Other Topics Concern  . Not on file   Social History Narrative  . No narrative on file    History  Smoking Status  . Former Smoker  . Packs/day: 0.50  . Years: 45.00  . Quit date: 08/21/2016  Smokeless Tobacco  . Never Used    History  Alcohol Use  . 0.6 oz/week  . 1 Cans of beer per week     Allergies  Allergen Reactions  . Penicillins Other (See Comments)    Family history if this allergy Has patient had a PCN reaction causing immediate rash, facial/tongue/throat swelling, SOB or lightheadedness with hypotension: No Has patient had a PCN reaction causing severe rash involving mucus membranes or skin necrosis: No Has patient had a PCN reaction that required hospitalization: No Has patient had a PCN reaction  occurring within the last 10 years: No If all of the above answers are "NO", then may proceed with Cephalosporin use.     Current Outpatient Prescriptions  Medication Sig Dispense Refill  . acetaminophen (TYLENOL) 500 MG tablet Take 500 mg by mouth every 6 (six) hours as needed (for pain or headaches).    Marland Kitchen albuterol (PROVENTIL HFA;VENTOLIN HFA) 108 (90 Base) MCG/ACT inhaler Inhale 2 puffs into the lungs every 6 (six) hours as needed for wheezing or shortness of breath. 3 Inhaler 3  . aspirin 81 MG tablet Take 81 mg by mouth daily.    Marland Kitchen atorvastatin (LIPITOR) 40 MG tablet TAKE 1 TABLET (40 MG TOTAL) BY MOUTH DAILY. 360 tablet 0  . budesonide-formoterol (SYMBICORT) 160-4.5 MCG/ACT inhaler Inhale 2 puffs into the lungs 2 (two) times daily. 1 Inhaler 0  . cetirizine (ZYRTEC) 10 MG tablet Take 10 mg by mouth daily as needed for allergies.    . Cholecalciferol (VITAMIN D) 2000 UNITS CAPS Take 2,000 Units by mouth daily.    . clopidogrel (PLAVIX) 75 MG tablet Take 1 tablet (75 mg total) by mouth daily with breakfast. 365 tablet 0  . Coenzyme Q10 (CO Q10) 100 MG CAPS Take 200 mg by mouth daily.    Marland Kitchen glucosamine-chondroitin 500-400 MG tablet Take 1 tablet by mouth 2 (two) times daily.    Marland Kitchen ibuprofen (ADVIL,MOTRIN) 200 MG tablet Take 400 mg by mouth every 6 (six) hours as needed (for pain or headaches).    . Multiple Vitamin (MULTIVITAMIN) capsule Take 1 capsule by mouth daily.    . pantoprazole (PROTONIX) 40 MG tablet TAKE 1 TABLET (40 MG TOTAL) BY MOUTH DAILY. TAKE 30-60 MIN BEFORE FIRST MEAL OF THE DAY 30 tablet 2   No current facility-administered medications for this visit.      Review of Systems:     Cardiac Review of Systems: Y or N  Chest Pain [  y  ]  Resting SOB [  y] Exertional SOB  [ y ]  Vertell Limber Florencio.Farrier ]   Pedal Edema [n   ]    Palpitations Florencio.Farrier  ] Syncope  Florencio.Farrier  ]   Presyncope [ n  ]  General  Review of Systems: [Y] = yes [  ]=no Constitional: recent weight change [ n ];  Wt loss over  the last 3 months [   ] anorexia [  ]; fatigue [  ]; nausea [  ]; night sweats [  ]; fever [  ]; or chills [  ];          Dental: poor dentition[  ]; Last Dentist visit:   Eye : blurred vision [  ]; diplopia [   ]; vision changes [  ];  Amaurosis fugax[  ]; Resp: cough Blue.Reese  ];  wheezing[y  ];  hemoptysis[ n ]; shortness of breath[ y ]; paroxysmal nocturnal dyspnea[ y ]; dyspnea on exertion[y  ]; or orthopnea[y  ];  GI:  gallstones[  ], vomiting[  ];  dysphagia[  ]; melena[  ];  hematochezia [  ]; heartburn[  ];   Hx of  Colonoscopy[  ]; GU: kidney stones [  ]; hematuria[  ];   dysuria [  ];  nocturia[  ];  history of     obstruction [  ]; urinary frequency [  ]             Skin: rash, swelling[  ];, hair loss[  ];  peripheral edema[  ];  or itching[  ]; Musculosketetal: myalgias[ y ];  joint swelling[  ];  joint erythema[  ];  joint pain[  ];  back pain[  ];  Heme/Lymph: bruising[  ];  bleeding[  ];  anemia[  ];  Neuro: TIA[  ];  headaches[  ];  stroke[  ];  vertigo[  ];  seizures[ n ];   paresthesias[  ];  difficulty walking[ n ];  Psych:depression[  ]; anxiety[  ];  Endocrine: diabetes[  ];  thyroid dysfunction[  ];  Immunizations: Flu up to date [ y ]; Pneumococcal up to date Blue.Reese  ];  Other:  Physical Exam: BP 140/70   Pulse 90   Resp 20   Ht 6\' 2"  (1.88 m)   Wt 191 lb (86.6 kg)   SpO2 91% Comment: RA  BMI 24.52 kg/m   PHYSICAL EXAMINATION: General appearance: alert, cooperative, appears older than stated age and mild distress Head: Normocephalic, without obvious abnormality, atraumatic Neck: no adenopathy, no carotid bruit, no JVD, supple, symmetrical, trachea midline and thyroid not enlarged, symmetric, no tenderness/mass/nodules Lymph nodes: Cervical, supraclavicular, and axillary nodes normal. Resp: diminished breath sounds LLL Back: symmetric, no curvature. ROM normal. No CVA tenderness. Cardio: regular rate and rhythm, S1, S2 normal, no murmur, click, rub or gallop GI: soft,  non-tender; bowel sounds normal; no masses,  no organomegaly Extremities: extremities normal, atraumatic, no cyanosis or edema and Homans sign is negative, no sign of DVT Neurologic: Grossly normal  Diagnostic Studies & Laboratory data:     Recent Radiology Findings:   Dg Chest 2 View  Result Date: 09/13/2016 CLINICAL DATA:  Follow-up left pleural effusion. EXAM: CHEST  2 VIEW COMPARISON:  09/07/2016 FINDINGS: Cardiomediastinal silhouette is unchanged. Moderate left pleural effusion has decreased from the prior study. Associated left lower lung atelectasis/ consolidation again noted. The right lung is clear. There is no evidence of pneumothorax. IMPRESSION: Decreased moderate left pleural effusion with continued associated left lower lung atelectasis/ consolidation. Electronically Signed   By: Margarette Canada M.D.   On: 09/13/2016 14:52   Dg Chest 2 View  Result Date: 09/07/2016 CLINICAL DATA:  Shortness of breath and recent diagnosis of left lower lobe pneumonia. EXAM:  CHEST  2 VIEW COMPARISON:  08/28/2016 CT, chest radiograph and prior studies. FINDINGS: Increasing moderate to large left pleural effusion and/or mid-lower left lung consolidation noted. The right lung is clear. There is no evidence of right pleural effusion. No pneumothorax. No other changes identified. IMPRESSION: Increasing moderate to large left pleural effusion and/or mid-lower left lung consolidation. Electronically Signed   By: Margarette Canada M.D.   On: 09/07/2016 11:43   Dg Chest 2 View  Result Date: 08/28/2016 CLINICAL DATA:  One day history of cough, shortness of breath, lower left-sided chest pain as well as left shoulder and neck pain and fever. History of COPD, coronary artery disease, current smoker. EXAM: CHEST  2 VIEW COMPARISON:  PA and lateral chest x-ray of June 13, 2016. FINDINGS: There is new increased density at the left lung base. This is consistent with atelectasis or infiltrate with small pleural effusion. The  right lung base is clear. The heart and pulmonary vascularity are normal. The mediastinum is normal in width. There is calcification in the wall of the aortic arch. The bony thorax is unremarkable. IMPRESSION: New left lower lobe atelectasis or pneumonia with small left pleural effusion. Underlying COPD. Followup PA and lateral chest X-ray is recommended in 3-4 weeks following trial of antibiotic therapy to ensure resolution and exclude underlying malignancy. Thoracic aortic atherosclerosis. Electronically Signed   By: David  Martinique M.D.   On: 08/28/2016 14:52   Ct Abdomen Pelvis W Contrast  Result Date: 08/28/2016 CLINICAL DATA:  Left upper quadrant abdominal pain for the past week with intermittent fever. EXAM: CT ABDOMEN AND PELVIS WITH CONTRAST TECHNIQUE: Multidetector CT imaging of the abdomen and pelvis was performed using the standard protocol following bolus administration of intravenous contrast. CONTRAST:  155mL ISOVUE-300 IOPAMIDOL (ISOVUE-300) INJECTION 61% COMPARISON:  Abdomen pelvis radiographs dated 07/13/2013. FINDINGS: Lower chest: Bilateral lower lobe airspace opacity and volume loss, greater on the left. Small left pleural effusion. Hepatobiliary: Small calcified granulomata in the liver. Normal appearing gallbladder. Pancreas: Unremarkable. No pancreatic ductal dilatation or surrounding inflammatory changes. Spleen: Multiple small calcified granulomata. Normal in size and shape. Adrenals/Urinary Tract: Small upper pole left renal cortical calcification. No urinary tract calculi or hydronephrosis. Normal appearing adrenal glands. Stomach/Bowel: Large number of sigmoid colon diverticula without evidence of diverticulitis. Normal appearing appendix, small bowel and stomach. Vascular/Lymphatic: Atheromatous arterial calcifications and plaques without aneurysm. Mildly enlarged lymph node at the root of the mesenteries on the left, with a short axis diameter of 11 mm on image number 26 of series 3.  There is also a mildly enlarged peripancreatic/gastrohepatic ligament node with a short axis diameter of 11 mm on image number 31 of series 3. There are additional borderline enlarged lymph nodes in the upper abdomen. No retroperitoneal adenopathy. Reproductive: Normal sized prostate gland. Other: No abdominal wall hernia or abnormality. No abdominopelvic ascites. Musculoskeletal: Lumbar and lower thoracic spine degenerative changes and straightening of the normal lumbar lordosis. Mild dextroconvex thoracolumbar scoliosis. IMPRESSION: 1. Left lower lobe pneumonia and atelectasis with a small left pleural effusion. 2. Milder right lower lobe atelectasis and possible pneumonia. 3. Mild upper abdominal adenopathy, most likely reactive in the absence of a visible neoplasm. Electronically Signed   By: Claudie Revering M.D.   On: 08/28/2016 15:27   US Thoracentesis Asp Pleural Space W/img Guide  Result Date: 09/07/2016 INDICATION: Cough. Shortness of breath. Loculated left pleural effusion. Request for diagnostic and therapeutic thoracentesis. EXAM: ULTRASOUND GUIDED LEFT THORACENTESIS MEDICATIONS: 1% Lidocaine = 10 mL. COMPLICATIONS:  None immediate. PROCEDURE: An ultrasound guided thoracentesis was thoroughly discussed with the patient and questions answered. The benefits, risks, alternatives and complications were also discussed. The patient understands and wishes to proceed with the procedure. Written consent was obtained. Ultrasound was performed to localize and mark an adequate pocket of fluid in the left chest. The area was then prepped and draped in the normal sterile fashion. 1% Lidocaine was used for local anesthesia. Under ultrasound guidance a 6 Fr Safe-T-Centesis catheter was introduced. Thoracentesis was performed. The catheter was removed and a dressing applied. FINDINGS: A total of approximately 100 mL of clear yellow fluid was removed. Unable to remove more secondary to effusion being loculated. Samples  were sent to the laboratory as requested by the clinical team. IMPRESSION: Successful ultrasound guided left thoracentesis yielding 100 mL of pleural fluid. Read by:  Gareth Eagle, PA-C Electronically Signed   By: Jacqulynn Cadet M.D.   On: 09/07/2016 14:19     I have independently reviewed the above radiologic studies.  Recent Lab Findings: Lab Results  Component Value Date   WBC 12.9 (H) 09/07/2016   HGB 12.5 (L) 09/07/2016   HCT 37.8 (L) 09/07/2016   PLT 519.0 (H) 09/07/2016   GLUCOSE 122 (H) 09/03/2016   CHOL 124 (L) 03/31/2015   TRIG 123 03/31/2015   HDL 32 (L) 03/31/2015   LDLCALC 67 03/31/2015   ALT 69 (H) 09/03/2016   AST 40 (H) 09/03/2016   NA 139 09/03/2016   K 4.5 09/03/2016   CL 107 09/03/2016   CREATININE 1.22 (H) 09/03/2016   BUN 19 09/03/2016   CO2 23 09/03/2016   TSH 2.254 06/21/2014      Assessment / Plan:   1/  Left lower lobe pneumonia and atelectasis with a small left pleural effusion,Chest x-ray today- Decreased moderate left pleural effusion with continued associated left lower lung atelectasis/ consolidation. Patient with community-acquired pneumonia treated with by mouth antibiotics for 7 days, with gradually resolving symptoms. Suggestion of a small effusion on CT scan of the abdomen on 58. Today chest x-rays improving. Before recommending any surgical intervention for his treatment a CT scan of the chest will be performed today or tomorrow. 2/ chronic obstructive pulmonary disease, and long-term smoker who is quit several months ago.     I  spent 40 minutes counseling the patient face to face and 50% or more the  time was spent in counseling and coordination of care. The total time spent in the appointment was 60 minutes.  Grace Isaac MD      [address] ,[place] [address] Office [phone]   Beeper [phone]  09/13/2016 4:58 PM

## 2016-09-14 ENCOUNTER — Encounter: Payer: Self-pay | Admitting: Cardiothoracic Surgery

## 2016-09-14 ENCOUNTER — Ambulatory Visit (INDEPENDENT_AMBULATORY_CARE_PROVIDER_SITE_OTHER): Payer: Medicare Other | Admitting: Cardiothoracic Surgery

## 2016-09-14 ENCOUNTER — Ambulatory Visit
Admission: RE | Admit: 2016-09-14 | Discharge: 2016-09-14 | Disposition: A | Discharge status: Home / Self Care | Payer: Medicare Other | Source: Ambulatory Visit | Attending: Cardiothoracic Surgery | Admitting: Cardiothoracic Surgery

## 2016-09-14 ENCOUNTER — Other Ambulatory Visit: Payer: Self-pay | Admitting: *Deleted

## 2016-09-14 VITALS — BP 123/67 | HR 84 | Resp 16 | Ht 74.0 in | Wt 191.0 lb

## 2016-09-14 DIAGNOSIS — J189 Pneumonia, unspecified organism: Secondary | ICD-10-CM

## 2016-09-14 DIAGNOSIS — J918 Pleural effusion in other conditions classified elsewhere: Secondary | ICD-10-CM

## 2016-09-14 DIAGNOSIS — J9811 Atelectasis: Secondary | ICD-10-CM | POA: Diagnosis not present

## 2016-09-14 DIAGNOSIS — J90 Pleural effusion, not elsewhere classified: Secondary | ICD-10-CM | POA: Diagnosis not present

## 2016-09-14 DIAGNOSIS — Z9889 Other specified postprocedural states: Secondary | ICD-10-CM

## 2016-09-14 NOTE — Progress Notes (Signed)
[address]       [place] [address]             [phone]                    Brent Gonzales [facility] Medical Record #[id number] Date of Birth: [birth year]  Referring: Tanda Rockers, MD Primary Care: [name], MD  Chief Complaint:    Chief Complaint  Patient presents with  . Pleural Effusion    f/u after Chest CT    History of Present Illness:    ANGLE KAREL 71 y.o. male is seen in the office  Today. Patient is followed in the pulmonary office for long-term smoking and COPD, quit smoking  February 2018.  He noted onset of fever chills and cough and left pleuritic chest pain. He was diagnosed with community-acquired pneumonia, a CT scan of the abdomen was performed that showed left lower lobe consolidation and a small effusion. He notes that a course of a week of by mouth antibiotics his symptoms have improved. Over the second last several days his pleuritic pain is better. Attempted thoracentesis only removed 100 mL of straw-colored fluid on 5/ 18. The patient is referred from the pulmonary office for consideration of left VATS. The patient denies any occupational asbestos exposure that he is aware of, worked primarily as a Ecologist.   Current Activity/ Functional Status:  Patient is independent with mobility/ambulation, transfers, ADL's, IADL's.   Zubrod Score: At the time of surgery this patient's most appropriate activity status/level should be described as: []     0    Normal activity, no symptoms [x]     1    Restricted in physical strenuous activity but ambulatory, able to do out light work []     2    Ambulatory and capable of self care, unable to do work activities, up and about               >50 % of waking hours                              []     3    Only limited self care, in bed greater than 50% of waking hours []     4    Completely disabled, no self care, confined to bed or chair []     5    Moribund   Past  Medical History:  Diagnosis Date  . Aorto-iliac disease ([place])    mild bilateral external. ABIs in the 0.9 range  . Carotid artery disease ([place])    right ICA stenosis  . COPD (chronic obstructive pulmonary disease) ([place])   . Coronary artery disease   . High cholesterol   . Hyperlipidemia   . Hypertension   . Tobacco abuse     Past Surgical History:  Procedure Laterality Date  . DOPPLER ECHOCARDIOGRAPHY    . Duplex ultrasound    . HERNIA REPAIR     left and right  . NM MYOVIEW LTD  03/2010   Inferior septal ischemia  . stress myocardial perfusion    . TONSILLECTOMY AND ADENOIDECTOMY      No family history on file.  Social History   Social History  . Marital status: Married    Spouse name: N/A  . Number of children: N/A  . Years of education: N/A   Occupational History  .  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 Smoking status: Former Smoker    Packs/day: 0.50    Years: 45.00    Quit date: 08/21/2016  . Smokeless tobacco: Never Used  . Alcohol use 0.6 oz/week    1 Cans of beer per week  . Drug use: No  . Sexual activity: Yes   Other Topics Concern  . Not on file   Social History Narrative  . No narrative on file    History  Smoking Status  . Former Smoker  . Packs/day: 0.50  . Years: 45.00  . Quit date: 08/21/2016  Smokeless Tobacco  . Never Used    History  Alcohol Use  . 0.6 oz/week  . 1 Cans of beer per week     Allergies  Allergen Reactions  . Penicillins Other (See Comments)    Family history if this allergy Has patient had a PCN reaction causing immediate rash, facial/tongue/throat swelling, SOB or lightheadedness with hypotension: No Has patient had a PCN reaction causing severe rash involving mucus membranes or skin necrosis: No Has patient had a PCN reaction that required hospitalization: No Has patient had a PCN reaction occurring within the last 10 years: No If all of the above  answers are "NO", then may proceed with Cephalosporin use.     Current Outpatient Prescriptions  Medication Sig Dispense Refill  . acetaminophen (TYLENOL) 500 MG tablet Take 500 mg by mouth every 6 (six) hours as needed (for pain or headaches).    Marland Kitchen albuterol (PROVENTIL HFA;VENTOLIN HFA) 108 (90 Base) MCG/ACT inhaler Inhale 2 puffs into the lungs every 6 (six) hours as needed for wheezing or shortness of breath. 3 Inhaler 3  . aspirin 81 MG tablet Take 81 mg by mouth daily.    Marland Kitchen atorvastatin (LIPITOR) 40 MG tablet TAKE 1 TABLET (40 MG TOTAL) BY MOUTH DAILY. 360 tablet 0  . budesonide-formoterol (SYMBICORT) 160-4.5 MCG/ACT inhaler Inhale 2 puffs into the lungs 2 (two) times daily. 1 Inhaler 0  . cetirizine (ZYRTEC) 10 MG tablet Take 10 mg by mouth daily as needed for allergies.    . Cholecalciferol (VITAMIN D) 2000 UNITS CAPS Take 2,000 Units by mouth daily.    . clopidogrel (PLAVIX) 75 MG tablet Take 1 tablet (75 mg total) by mouth daily with breakfast. 365 tablet 0  . Coenzyme Q10 (CO Q10) 100 MG CAPS Take 200 mg by mouth daily.    Marland Kitchen glucosamine-chondroitin 500-400 MG tablet Take 1 tablet by mouth 2 (two) times daily.    Marland Kitchen ibuprofen (ADVIL,MOTRIN) 200 MG tablet Take 400 mg by mouth every 6 (six) hours as needed (for pain or headaches).    . Multiple Vitamin (MULTIVITAMIN) capsule Take 1 capsule by mouth daily.    . pantoprazole (PROTONIX) 40 MG tablet TAKE 1 TABLET (40 MG TOTAL) BY MOUTH DAILY. TAKE 30-60 MIN BEFORE FIRST MEAL OF THE DAY 30 tablet 2   No current facility-administered medications for this visit.      Review of Systems:     Cardiac Review of Systems: Y or N  Chest Pain [  y  ]  Resting SOB [  y] Exertional SOB  [ y ]  Vertell Limber Florencio.Farrier ]   Pedal Edema [n   ]    Palpitations Florencio.Farrier  ] Syncope  Florencio.Farrier  ]   Presyncope [ n  ]  General Review of Systems: [Y] = yes [  ]=  no Constitional: recent weight change [ n ];  Wt loss over the last 3 months [   ] anorexia [  ]; fatigue [  ]; nausea [   ]; night sweats [  ]; fever [  ]; or chills [  ];          Dental: poor dentition[  ]; Last Dentist visit:   Eye : blurred vision [  ]; diplopia [   ]; vision changes [  ];  Amaurosis fugax[  ]; Resp: cough Blue.Reese  ];  wheezing[y  ];  hemoptysis[ n ]; shortness of breath[ y ]; paroxysmal nocturnal dyspnea[ y ]; dyspnea on exertion[y  ]; or orthopnea[y  ];  GI:  gallstones[  ], vomiting[  ];  dysphagia[  ]; melena[  ];  hematochezia [  ]; heartburn[  ];   Hx of  Colonoscopy[  ]; GU: kidney stones [  ]; hematuria[  ];   dysuria [  ];  nocturia[  ];  history of     obstruction [  ]; urinary frequency [  ]             Skin: rash, swelling[  ];, hair loss[  ];  peripheral edema[  ];  or itching[  ]; Musculosketetal: myalgias[ y ];  joint swelling[  ];  joint erythema[  ];  joint pain[  ];  back pain[  ];  Heme/Lymph: bruising[  ];  bleeding[  ];  anemia[  ];  Neuro: TIA[  ];  headaches[  ];  stroke[  ];  vertigo[  ];  seizures[ n ];   paresthesias[  ];  difficulty walking[ n ];  Psych:depression[  ]; anxiety[  ];  Endocrine: diabetes[  ];  thyroid dysfunction[  ];  Immunizations: Flu up to date [ y ]; Pneumococcal up to date Blue.Reese  ];  Other:  Physical Exam: BP 123/67 (BP Location: Left Arm, Patient Position: Sitting, Cuff Size: Large)   Pulse 84   Resp 16   Ht 6\' 2"  (1.88 m)   Wt 191 lb (86.6 kg)   SpO2 96% Comment: RA  BMI 24.52 kg/m   PHYSICAL EXAMINATION:  General appearance: alert, cooperative and no distress Head: Normocephalic, without obvious abnormality, atraumatic Neck: no adenopathy, no carotid bruit, no JVD, supple, symmetrical, trachea midline and thyroid not enlarged, symmetric, no tenderness/mass/nodules Lymph nodes: Cervical, supraclavicular, and axillary nodes normal. Resp: diminished breath sounds LLL and RLL  Back: symmetric, no curvature. ROM normal. No CVA tenderness. Cardio: regular rate and rhythm, S1, S2 normal, no murmur, click, rub or gallop GI: soft, non-tender; bowel  sounds normal; no masses,  no organomegaly Extremities: extremities normal, atraumatic, no cyanosis or edema and Homans sign is negative, no sign of DVT Neurologic: Grossly normal    Diagnostic Studies & Laboratory data:     Recent Radiology Findings:  Ct Chest Wo Contrast  Result Date: 09/14/2016 CLINICAL DATA:  Pleural effusion with thoracentesis. Pneumonia. Prior smoker. EXAM: CT CHEST WITHOUT CONTRAST TECHNIQUE: Multidetector CT imaging of the chest was performed following the standard protocol without IV contrast. COMPARISON:  Chest radiograph 09/13/2016, CT abdomen 08/28/2016 FINDINGS: Cardiovascular: Coronary artery calcification and aortic atherosclerotic calcification. Mediastinum/Nodes: No axillary or supraclavicular adenopathy. 13 mm LEFT lower paratracheal lymph node. No supraclavicular adenopathy. Similar mild subcarinal adenopathy with a 13 mm. Lungs/Pleura: There is loculated pleural fluid in the lower LEFT hemithorax. There is atelectasis of the LEFT lower lobe. No clear empyema or pulmonary abscess. LEFT upper lobe  is clear. In comparison to CT of 08/28/2016 there is improvement in airspace disease in LEFT lower lobe but increased pleural fluid. RIGHT upper lobe and RIGHT lower lobe are clear. Centrilobular emphysema the upper lobes. Upper Abdomen: Limited view of the liver, kidneys, pancreas are unremarkable. Normal adrenal glands. Granulomata within the spleen. Musculoskeletal: No aggressive osseous lesion. IMPRESSION: 1. Loculated pleural fluid in the LEFT hemithorax with LEFT lower lobe atelectasis. No clear evidence of empyema or pulmonary abscess. Improvement in the pneumonia pattern in the LEFT lower lobe seen on comparison CT. However, effusion is increased. 2. Upper lobe centrilobular emphysema. 3. Mild mediastinal lymphadenopathy is favored reactive. Electronically Signed   By: Suzy Bouchard M.D.   On: 09/14/2016 15:43    Dg Chest 2 View  Result Date: 09/13/2016 CLINICAL  DATA:  Follow-up left pleural effusion. EXAM: CHEST  2 VIEW COMPARISON:  09/07/2016 FINDINGS: Cardiomediastinal silhouette is unchanged. Moderate left pleural effusion has decreased from the prior study. Associated left lower lung atelectasis/ consolidation again noted. The right lung is clear. There is no evidence of pneumothorax. IMPRESSION: Decreased moderate left pleural effusion with continued associated left lower lung atelectasis/ consolidation. Electronically Signed   By: Margarette Canada M.D.   On: 09/13/2016 14:52   Dg Chest 2 View  Result Date: 09/07/2016 CLINICAL DATA:  Shortness of breath and recent diagnosis of left lower lobe pneumonia. EXAM: CHEST  2 VIEW COMPARISON:  08/28/2016 CT, chest radiograph and prior studies. FINDINGS: Increasing moderate to large left pleural effusion and/or mid-lower left lung consolidation noted. The right lung is clear. There is no evidence of right pleural effusion. No pneumothorax. No other changes identified. IMPRESSION: Increasing moderate to large left pleural effusion and/or mid-lower left lung consolidation. Electronically Signed   By: Margarette Canada M.D.   On: 09/07/2016 11:43   Dg Chest 2 View  Result Date: 08/28/2016 CLINICAL DATA:  One day history of cough, shortness of breath, lower left-sided chest pain as well as left shoulder and neck pain and fever. History of COPD, coronary artery disease, current smoker. EXAM: CHEST  2 VIEW COMPARISON:  PA and lateral chest x-ray of June 13, 2016. FINDINGS: There is new increased density at the left lung base. This is consistent with atelectasis or infiltrate with small pleural effusion. The right lung base is clear. The heart and pulmonary vascularity are normal. The mediastinum is normal in width. There is calcification in the wall of the aortic arch. The bony thorax is unremarkable. IMPRESSION: New left lower lobe atelectasis or pneumonia with small left pleural effusion. Underlying COPD. Followup PA and lateral  chest X-ray is recommended in 3-4 weeks following trial of antibiotic therapy to ensure resolution and exclude underlying malignancy. Thoracic aortic atherosclerosis. Electronically Signed   By: David  Martinique M.D.   On: 08/28/2016 14:52   Ct Abdomen Pelvis W Contrast  Result Date: 08/28/2016 CLINICAL DATA:  Left upper quadrant abdominal pain for the past week with intermittent fever. EXAM: CT ABDOMEN AND PELVIS WITH CONTRAST TECHNIQUE: Multidetector CT imaging of the abdomen and pelvis was performed using the standard protocol following bolus administration of intravenous contrast. CONTRAST:  113mL ISOVUE-300 IOPAMIDOL (ISOVUE-300) INJECTION 61% COMPARISON:  Abdomen pelvis radiographs dated 07/13/2013. FINDINGS: Lower chest: Bilateral lower lobe airspace opacity and volume loss, greater on the left. Small left pleural effusion. Hepatobiliary: Small calcified granulomata in the liver. Normal appearing gallbladder. Pancreas: Unremarkable. No pancreatic ductal dilatation or surrounding inflammatory changes. Spleen: Multiple small calcified granulomata. Normal in size and  shape. Adrenals/Urinary Tract: Small upper pole left renal cortical calcification. No urinary tract calculi or hydronephrosis. Normal appearing adrenal glands. Stomach/Bowel: Large number of sigmoid colon diverticula without evidence of diverticulitis. Normal appearing appendix, small bowel and stomach. Vascular/Lymphatic: Atheromatous arterial calcifications and plaques without aneurysm. Mildly enlarged lymph node at the root of the mesenteries on the left, with a short axis diameter of 11 mm on image number 26 of series 3. There is also a mildly enlarged peripancreatic/gastrohepatic ligament node with a short axis diameter of 11 mm on image number 31 of series 3. There are additional borderline enlarged lymph nodes in the upper abdomen. No retroperitoneal adenopathy. Reproductive: Normal sized prostate gland. Other: No abdominal wall hernia or  abnormality. No abdominopelvic ascites. Musculoskeletal: Lumbar and lower thoracic spine degenerative changes and straightening of the normal lumbar lordosis. Mild dextroconvex thoracolumbar scoliosis. IMPRESSION: 1. Left lower lobe pneumonia and atelectasis with a small left pleural effusion. 2. Milder right lower lobe atelectasis and possible pneumonia. 3. Mild upper abdominal adenopathy, most likely reactive in the absence of a visible neoplasm. Electronically Signed   By: Claudie Revering M.D.   On: 08/28/2016 15:27   US Thoracentesis Asp Pleural Space W/img Guide  Result Date: 09/07/2016 INDICATION: Cough. Shortness of breath. Loculated left pleural effusion. Request for diagnostic and therapeutic thoracentesis. EXAM: ULTRASOUND GUIDED LEFT THORACENTESIS MEDICATIONS: 1% Lidocaine = 10 mL. COMPLICATIONS: None immediate. PROCEDURE: An ultrasound guided thoracentesis was thoroughly discussed with the patient and questions answered. The benefits, risks, alternatives and complications were also discussed. The patient understands and wishes to proceed with the procedure. Written consent was obtained. Ultrasound was performed to localize and mark an adequate pocket of fluid in the left chest. The area was then prepped and draped in the normal sterile fashion. 1% Lidocaine was used for local anesthesia. Under ultrasound guidance a 6 Fr Safe-T-Centesis catheter was introduced. Thoracentesis was performed. The catheter was removed and a dressing applied. FINDINGS: A total of approximately 100 mL of clear yellow fluid was removed. Unable to remove more secondary to effusion being loculated. Samples were sent to the laboratory as requested by the clinical team. IMPRESSION: Successful ultrasound guided left thoracentesis yielding 100 mL of pleural fluid. Read by:  Gareth Eagle, PA-C Electronically Signed   By: Jacqulynn Cadet M.D.   On: 09/07/2016 14:19     I have independently reviewed the above radiologic  studies.  Recent Lab Findings: Lab Results  Component Value Date   WBC 12.9 (H) 09/07/2016   HGB 12.5 (L) 09/07/2016   HCT 37.8 (L) 09/07/2016   PLT 519.0 (H) 09/07/2016   GLUCOSE 122 (H) 09/03/2016   CHOL 124 (L) 03/31/2015   TRIG 123 03/31/2015   HDL 32 (L) 03/31/2015   LDLCALC 67 03/31/2015   ALT 69 (H) 09/03/2016   AST 40 (H) 09/03/2016   NA 139 09/03/2016   K 4.5 09/03/2016   CL 107 09/03/2016   CREATININE 1.22 (H) 09/03/2016   BUN 19 09/03/2016   CO2 23 09/03/2016   TSH 2.254 06/21/2014   PFT"S 07/24/2016 FEV1  2.11  56% DLCO 16.42  43 %  Interpretation: The FVC, FEV1, FEV1/FVC ratio and FEF25-75% are reduced indicating airway obstruction. The SVC is reduced, but the TLC is within normal limits. Following administration of bronchodilators, there is no significant response. The reduced diffusing capacity indicates a severe loss of functional alveolar capillary surface. However, the diffusing capacity was not corrected for the patient's hemoglobin. Pulmonary Function Diagnosis: Moderate Obstructive  Airways Disease Severe Diffusion Defect   Assessment / Plan:   1/  Left lower lobe pneumonia and atelectasis with a small left pleural effusion, Patient with community-acquired pneumonia treated with by mouth antibiotics for 7 days, with gradually resolving symptoms.  With decreasing parenchymal involvement and increasing pleural fluid and pleural reaction I recommended to the patient that we proceed with bronchoscopy left video-assisted thoracoscopy decortication with pleural biopsy in order to prevent developing long-term entrapped lung. The patient has no previous asbestos exposure, but will obtain pathologic biopsies of pleural tissue, along with cultures. Risks and options and rationale of proceeding with surgical intervention were discussed with the patient and his wife in detail, he is  willing to proceed. He's currently on Plavix, he has no known documented stents in  place. He saw Dr. Gwenlyn Found 2 weeks ago. Plan for surgery May 30.   2/ chronic obstructive pulmonary disease, and long-term smoker who is quit several months ago.  -Moderate Obstructive Airways Disease Severe Diffusion Defect   Grace Isaac MD      [address] [place] [address] Office [phone]   [place]  09/14/2016 4:11 PM

## 2016-09-18 ENCOUNTER — Encounter (HOSPITAL_COMMUNITY): Payer: Self-pay | Admitting: *Deleted

## 2016-09-19 ENCOUNTER — Inpatient Hospital Stay (HOSPITAL_COMMUNITY): Payer: Medicare Other | Admitting: Anesthesiology

## 2016-09-19 ENCOUNTER — Encounter (HOSPITAL_COMMUNITY)
Admission: RE | Disposition: A | Discharge status: Home / Self Care | Payer: Self-pay | Source: Ambulatory Visit | Attending: Cardiothoracic Surgery

## 2016-09-19 ENCOUNTER — Inpatient Hospital Stay (HOSPITAL_COMMUNITY): Payer: Medicare Other

## 2016-09-19 ENCOUNTER — Encounter (HOSPITAL_COMMUNITY): Payer: Self-pay | Admitting: General Practice

## 2016-09-19 ENCOUNTER — Inpatient Hospital Stay (HOSPITAL_COMMUNITY)
Admission: RE | Admit: 2016-09-19 | Discharge: 2016-09-23 | DRG: 164 | Disposition: A | Discharge status: Home / Self Care | Payer: Medicare Other | Source: Ambulatory Visit | Attending: Cardiothoracic Surgery | Admitting: Cardiothoracic Surgery

## 2016-09-19 DIAGNOSIS — Z9889 Other specified postprocedural states: Secondary | ICD-10-CM

## 2016-09-19 DIAGNOSIS — Z5321 Procedure and treatment not carried out due to patient leaving prior to being seen by health care provider: Secondary | ICD-10-CM | POA: Diagnosis not present

## 2016-09-19 DIAGNOSIS — J869 Pyothorax without fistula: Principal | ICD-10-CM | POA: Diagnosis present

## 2016-09-19 DIAGNOSIS — E7800 Pure hypercholesterolemia, unspecified: Secondary | ICD-10-CM | POA: Diagnosis not present

## 2016-09-19 DIAGNOSIS — Z938 Other artificial opening status: Secondary | ICD-10-CM

## 2016-09-19 DIAGNOSIS — B953 Streptococcus pneumoniae as the cause of diseases classified elsewhere: Secondary | ICD-10-CM | POA: Diagnosis present

## 2016-09-19 DIAGNOSIS — I2510 Atherosclerotic heart disease of native coronary artery without angina pectoris: Secondary | ICD-10-CM | POA: Diagnosis present

## 2016-09-19 DIAGNOSIS — Y838 Other surgical procedures as the cause of abnormal reaction of the patient, or of later complication, without mention of misadventure at the time of the procedure: Secondary | ICD-10-CM | POA: Diagnosis present

## 2016-09-19 DIAGNOSIS — J449 Chronic obstructive pulmonary disease, unspecified: Secondary | ICD-10-CM | POA: Diagnosis not present

## 2016-09-19 DIAGNOSIS — R040 Epistaxis: Secondary | ICD-10-CM | POA: Diagnosis not present

## 2016-09-19 DIAGNOSIS — Z87891 Personal history of nicotine dependence: Secondary | ICD-10-CM | POA: Diagnosis not present

## 2016-09-19 DIAGNOSIS — Z880 Allergy status to penicillin: Secondary | ICD-10-CM

## 2016-09-19 DIAGNOSIS — Z9689 Presence of other specified functional implants: Secondary | ICD-10-CM

## 2016-09-19 DIAGNOSIS — D62 Acute posthemorrhagic anemia: Secondary | ICD-10-CM | POA: Diagnosis not present

## 2016-09-19 DIAGNOSIS — I739 Peripheral vascular disease, unspecified: Secondary | ICD-10-CM | POA: Diagnosis present

## 2016-09-19 DIAGNOSIS — J439 Emphysema, unspecified: Secondary | ICD-10-CM | POA: Diagnosis not present

## 2016-09-19 DIAGNOSIS — J9811 Atelectasis: Secondary | ICD-10-CM | POA: Diagnosis not present

## 2016-09-19 DIAGNOSIS — J90 Pleural effusion, not elsewhere classified: Secondary | ICD-10-CM | POA: Diagnosis present

## 2016-09-19 DIAGNOSIS — L7632 Postprocedural hematoma of skin and subcutaneous tissue following other procedure: Secondary | ICD-10-CM | POA: Diagnosis not present

## 2016-09-19 DIAGNOSIS — I10 Essential (primary) hypertension: Secondary | ICD-10-CM | POA: Diagnosis not present

## 2016-09-19 DIAGNOSIS — Z4682 Encounter for fitting and adjustment of non-vascular catheter: Secondary | ICD-10-CM | POA: Diagnosis not present

## 2016-09-19 HISTORY — PX: VIDEO BRONCHOSCOPY: SHX5072

## 2016-09-19 HISTORY — PX: VIDEO ASSISTED THORACOSCOPY (VATS)/DECORTICATION: SHX6171

## 2016-09-19 HISTORY — DX: Pneumonia, unspecified organism: J18.9

## 2016-09-19 LAB — CBC
HCT: 34.8 % — ABNORMAL LOW (ref 39.0–52.0)
Hemoglobin: 11.3 g/dL — ABNORMAL LOW (ref 13.0–17.0)
MCH: 31 pg (ref 26.0–34.0)
MCHC: 32.5 g/dL (ref 30.0–36.0)
MCV: 95.6 fL (ref 78.0–100.0)
Platelets: 380 10*3/uL (ref 150–400)
RBC: 3.64 MIL/uL — ABNORMAL LOW (ref 4.22–5.81)
RDW: 12.9 % (ref 11.5–15.5)
WBC: 8.6 10*3/uL (ref 4.0–10.5)

## 2016-09-19 LAB — GLUCOSE, PLEURAL OR PERITONEAL FLUID: Glucose, Fluid: 30 mg/dL

## 2016-09-19 LAB — COMPREHENSIVE METABOLIC PANEL
ALT: 25 U/L (ref 17–63)
AST: 19 U/L (ref 15–41)
Albumin: 2.3 g/dL — ABNORMAL LOW (ref 3.5–5.0)
Alkaline Phosphatase: 66 U/L (ref 38–126)
Anion gap: 8 (ref 5–15)
BUN: 13 mg/dL (ref 6–20)
CO2: 24 mmol/L (ref 22–32)
Calcium: 8.9 mg/dL (ref 8.9–10.3)
Chloride: 106 mmol/L (ref 101–111)
Creatinine, Ser: 0.93 mg/dL (ref 0.61–1.24)
GFR calc Af Amer: >60 mL/min (ref >60)
GFR calc non Af Amer: >60 mL/min (ref >60)
Glucose, Bld: 101 mg/dL — ABNORMAL HIGH (ref 65–99)
Potassium: 3.9 mmol/L (ref 3.5–5.1)
Sodium: 138 mmol/L (ref 135–145)
Total Bilirubin: 0.5 mg/dL (ref 0.3–1.2)
Total Protein: 7 g/dL (ref 6.5–8.1)

## 2016-09-19 LAB — URINALYSIS, ROUTINE W REFLEX MICROSCOPIC
Bilirubin Urine: NEGATIVE
Glucose, UA: NEGATIVE mg/dL
Hgb urine dipstick: NEGATIVE
Ketones, ur: NEGATIVE mg/dL
Leukocytes, UA: NEGATIVE
Nitrite: NEGATIVE
Protein, ur: NEGATIVE mg/dL
Specific Gravity, Urine: 1.016 (ref 1.005–1.030)
pH: 5 (ref 5.0–8.0)

## 2016-09-19 LAB — BODY FLUID CELL COUNT WITH DIFFERENTIAL
Eos, Fluid: 7 %
Lymphs, Fluid: 36 %
Monocyte-Macrophage-Serous Fluid: 8 % — ABNORMAL LOW (ref 50–90)
Neutrophil Count, Fluid: 49 % — ABNORMAL HIGH (ref 0–25)
Total Nucleated Cell Count, Fluid: 367 cu mm (ref 0–1000)

## 2016-09-19 LAB — APTT: aPTT: 39 seconds — ABNORMAL HIGH (ref 24–36)

## 2016-09-19 LAB — PROTEIN, PLEURAL OR PERITONEAL FLUID: Total protein, fluid: 5.2 g/dL

## 2016-09-19 LAB — BLOOD GAS, ARTERIAL
Acid-Base Excess: 1.5 mmol/L (ref 0.0–2.0)
Bicarbonate: 25.1 mmol/L (ref 20.0–28.0)
Drawn by: 21179
FIO2: 0.21
O2 Saturation: 95.4 %
Patient temperature: 98.6
pCO2 arterial: 36.6 mmHg (ref 32.0–48.0)
pH, Arterial: 7.451 — ABNORMAL HIGH (ref 7.350–7.450)
pO2, Arterial: 80.3 mmHg — ABNORMAL LOW (ref 83.0–108.0)

## 2016-09-19 LAB — PROTIME-INR
INR: 1.17
Prothrombin Time: 14.9 seconds (ref 11.4–15.2)

## 2016-09-19 LAB — GLUCOSE, CAPILLARY
Glucose-Capillary: 110 mg/dL — ABNORMAL HIGH (ref 65–99)
Glucose-Capillary: 156 mg/dL — ABNORMAL HIGH (ref 65–99)
Glucose-Capillary: 232 mg/dL — ABNORMAL HIGH (ref 65–99)
Glucose-Capillary: 80 mg/dL (ref 65–99)

## 2016-09-19 LAB — LACTATE DEHYDROGENASE, PLEURAL OR PERITONEAL FLUID: LD, Fluid: 583 U/L — ABNORMAL HIGH (ref 3–23)

## 2016-09-19 LAB — ABO/RH: ABO/RH(D): A POS

## 2016-09-19 LAB — SURGICAL PCR SCREEN
MRSA, PCR: NEGATIVE
Staphylococcus aureus: NEGATIVE

## 2016-09-19 LAB — PREPARE RBC (CROSSMATCH)

## 2016-09-19 SURGERY — BRONCHOSCOPY, VIDEO-ASSISTED
Anesthesia: General | Site: Chest

## 2016-09-19 MED ORDER — POTASSIUM CHLORIDE 10 MEQ/50ML IV SOLN: 10.0000 meq | Freq: Every day | INTRAVENOUS | Status: DC | PRN

## 2016-09-19 MED ORDER — ENOXAPARIN SODIUM 40 MG/0.4ML ~~LOC~~ SOLN
40.0000 mg | Freq: Every day | SUBCUTANEOUS | Status: DC
  Administered 2016-09-20 – 2016-09-23 (×4): 40 mg via SUBCUTANEOUS
  Filled 2016-09-19 (×4): qty 0.4

## 2016-09-19 MED ORDER — LIDOCAINE 2% (20 MG/ML) 5 ML SYRINGE
INTRAMUSCULAR | Status: AC
  Filled 2016-09-19: qty 5

## 2016-09-19 MED ORDER — HYDROMORPHONE HCL 1 MG/ML IJ SOLN
0.2500 mg | INTRAMUSCULAR | Status: DC | PRN
  Administered 2016-09-19 (×2): 0.5 mg via INTRAVENOUS

## 2016-09-19 MED ORDER — PROPOFOL 10 MG/ML IV BOLUS
INTRAVENOUS | Status: DC | PRN
  Administered 2016-09-19: 130 mg via INTRAVENOUS
  Administered 2016-09-19: 50 mg via INTRAVENOUS

## 2016-09-19 MED ORDER — FENTANYL 40 MCG/ML IV SOLN
INTRAVENOUS | Status: DC
  Administered 2016-09-19: 1000 ug via INTRAVENOUS
  Administered 2016-09-19: 75 ug via INTRAVENOUS
  Administered 2016-09-19: 60 ug via INTRAVENOUS
  Administered 2016-09-20: 0 ug via INTRAVENOUS
  Administered 2016-09-20: 75 ug via INTRAVENOUS
  Administered 2016-09-20: 30 ug via INTRAVENOUS
  Filled 2016-09-19: qty 25

## 2016-09-19 MED ORDER — DIPHENHYDRAMINE HCL 50 MG/ML IJ SOLN: 12.5000 mg | Freq: Four times a day (QID) | INTRAMUSCULAR | Status: DC | PRN

## 2016-09-19 MED ORDER — LACTATED RINGERS IV SOLN
INTRAVENOUS | Status: DC | PRN
  Administered 2016-09-19: 07:00:00 via INTRAVENOUS

## 2016-09-19 MED ORDER — FENTANYL CITRATE (PF) 100 MCG/2ML IJ SOLN
INTRAMUSCULAR | Status: DC | PRN
  Administered 2016-09-19 (×5): 50 ug via INTRAVENOUS
  Administered 2016-09-19 (×2): 25 ug via INTRAVENOUS

## 2016-09-19 MED ORDER — OXYCODONE HCL 5 MG PO TABS
5.0000 mg | ORAL_TABLET | ORAL | Status: DC | PRN
  Administered 2016-09-19 – 2016-09-22 (×2): 10 mg via ORAL
  Filled 2016-09-19: qty 2

## 2016-09-19 MED ORDER — ALBUTEROL SULFATE (2.5 MG/3ML) 0.083% IN NEBU
2.5000 mg | INHALATION_SOLUTION | Freq: Four times a day (QID) | RESPIRATORY_TRACT | Status: DC | PRN
Start: 2016-09-19 — End: 2016-09-23

## 2016-09-19 MED ORDER — NALOXONE HCL 0.4 MG/ML IJ SOLN
0.4000 mg | INTRAMUSCULAR | Status: DC | PRN
  Filled 2016-09-19: qty 1

## 2016-09-19 MED ORDER — FENTANYL CITRATE (PF) 250 MCG/5ML IJ SOLN
INTRAMUSCULAR | Status: AC
  Filled 2016-09-19: qty 5

## 2016-09-19 MED ORDER — 0.9 % SODIUM CHLORIDE (POUR BTL) OPTIME
TOPICAL | Status: DC | PRN
  Administered 2016-09-19: 4000 mL

## 2016-09-19 MED ORDER — TRAMADOL HCL 50 MG PO TABS
50.0000 mg | ORAL_TABLET | Freq: Four times a day (QID) | ORAL | Status: DC | PRN
  Administered 2016-09-20 – 2016-09-22 (×5): 100 mg via ORAL
  Filled 2016-09-19 (×5): qty 2

## 2016-09-19 MED ORDER — VANCOMYCIN HCL IN DEXTROSE 1-5 GM/200ML-% IV SOLN
1000.0000 mg | INTRAVENOUS | Status: AC
  Administered 2016-09-19: 1000 mg via INTRAVENOUS

## 2016-09-19 MED ORDER — INSULIN ASPART 100 UNIT/ML ~~LOC~~ SOLN
0.0000 [IU] | SUBCUTANEOUS | Status: DC
  Administered 2016-09-19: 8 [IU] via SUBCUTANEOUS
  Administered 2016-09-19: 2 [IU] via SUBCUTANEOUS

## 2016-09-19 MED ORDER — HYDROMORPHONE HCL 1 MG/ML IJ SOLN
INTRAMUSCULAR | Status: AC
  Administered 2016-09-19: 0.5 mg via INTRAVENOUS
  Filled 2016-09-19: qty 1

## 2016-09-19 MED ORDER — PROPOFOL 10 MG/ML IV BOLUS
INTRAVENOUS | Status: AC
  Filled 2016-09-19: qty 40

## 2016-09-19 MED ORDER — ONDANSETRON HCL 4 MG/2ML IJ SOLN
INTRAMUSCULAR | Status: AC
  Filled 2016-09-19: qty 2

## 2016-09-19 MED ORDER — DEXTROSE 5 % IV SOLN
INTRAVENOUS | Status: DC | PRN
  Administered 2016-09-19: 30 ug/min via INTRAVENOUS

## 2016-09-19 MED ORDER — SUGAMMADEX SODIUM 200 MG/2ML IV SOLN
INTRAVENOUS | Status: DC | PRN
  Administered 2016-09-19: 200 mg via INTRAVENOUS

## 2016-09-19 MED ORDER — ATORVASTATIN CALCIUM 40 MG PO TABS
40.0000 mg | ORAL_TABLET | Freq: Every day | ORAL | Status: DC
  Administered 2016-09-20 – 2016-09-22 (×3): 40 mg via ORAL
  Filled 2016-09-19 (×3): qty 1

## 2016-09-19 MED ORDER — LIDOCAINE HCL (CARDIAC) 20 MG/ML IV SOLN
INTRAVENOUS | Status: DC | PRN
  Administered 2016-09-19: 100 mg via INTRAVENOUS

## 2016-09-19 MED ORDER — LABETALOL HCL 5 MG/ML IV SOLN
INTRAVENOUS | Status: AC
  Filled 2016-09-19: qty 4

## 2016-09-19 MED ORDER — PHENYLEPHRINE 40 MCG/ML (10ML) SYRINGE FOR IV PUSH (FOR BLOOD PRESSURE SUPPORT)
PREFILLED_SYRINGE | INTRAVENOUS | Status: AC
  Filled 2016-09-19: qty 10

## 2016-09-19 MED ORDER — CHLORHEXIDINE GLUCONATE CLOTH 2 % EX PADS: 6.0000 | MEDICATED_PAD | Freq: Every day | CUTANEOUS | Status: DC

## 2016-09-19 MED ORDER — MUPIROCIN 2 % EX OINT
TOPICAL_OINTMENT | CUTANEOUS | Status: AC
  Administered 2016-09-19: 1
  Filled 2016-09-19: qty 22

## 2016-09-19 MED ORDER — DEXAMETHASONE SODIUM PHOSPHATE 10 MG/ML IJ SOLN
INTRAMUSCULAR | Status: AC
  Filled 2016-09-19: qty 1

## 2016-09-19 MED ORDER — SENNOSIDES-DOCUSATE SODIUM 8.6-50 MG PO TABS
1.0000 | ORAL_TABLET | Freq: Every day | ORAL | Status: DC
  Administered 2016-09-19: 1 via ORAL
  Filled 2016-09-19: qty 1

## 2016-09-19 MED ORDER — ASPIRIN EC 81 MG PO TBEC
81.0000 mg | DELAYED_RELEASE_TABLET | Freq: Every day | ORAL | Status: DC
  Administered 2016-09-20 – 2016-09-23 (×4): 81 mg via ORAL
  Filled 2016-09-19 (×5): qty 1

## 2016-09-19 MED ORDER — ORAL CARE MOUTH RINSE
15.0000 mL | Freq: Two times a day (BID) | OROMUCOSAL | Status: DC
  Administered 2016-09-19 – 2016-09-22 (×3): 15 mL via OROMUCOSAL

## 2016-09-19 MED ORDER — PANTOPRAZOLE SODIUM 40 MG PO TBEC
40.0000 mg | DELAYED_RELEASE_TABLET | Freq: Every day | ORAL | Status: DC
  Administered 2016-09-20 – 2016-09-23 (×4): 40 mg via ORAL
  Filled 2016-09-19 (×4): qty 1

## 2016-09-19 MED ORDER — SUGAMMADEX SODIUM 200 MG/2ML IV SOLN
INTRAVENOUS | Status: AC
  Filled 2016-09-19: qty 2

## 2016-09-19 MED ORDER — DEXAMETHASONE SODIUM PHOSPHATE 10 MG/ML IJ SOLN
INTRAMUSCULAR | Status: DC | PRN
  Administered 2016-09-19: 10 mg via INTRAVENOUS

## 2016-09-19 MED ORDER — MIDAZOLAM HCL 5 MG/5ML IJ SOLN
INTRAMUSCULAR | Status: DC | PRN
  Administered 2016-09-19 (×2): 1 mg via INTRAVENOUS

## 2016-09-19 MED ORDER — DIPHENHYDRAMINE HCL 12.5 MG/5ML PO ELIX: 12.5000 mg | ORAL_SOLUTION | Freq: Four times a day (QID) | ORAL | Status: DC | PRN

## 2016-09-19 MED ORDER — BISACODYL 5 MG PO TBEC
10.0000 mg | DELAYED_RELEASE_TABLET | Freq: Every day | ORAL | Status: DC
  Administered 2016-09-19: 10 mg via ORAL
  Filled 2016-09-19: qty 2

## 2016-09-19 MED ORDER — DEXTROSE-NACL 5-0.45 % IV SOLN
INTRAVENOUS | Status: DC
  Administered 2016-09-19 (×2): via INTRAVENOUS

## 2016-09-19 MED ORDER — LABETALOL HCL 5 MG/ML IV SOLN
INTRAVENOUS | Status: DC | PRN
  Administered 2016-09-19: 5 mg via INTRAVENOUS

## 2016-09-19 MED ORDER — ALBUTEROL SULFATE HFA 108 (90 BASE) MCG/ACT IN AERS
2.0000 | INHALATION_SPRAY | Freq: Four times a day (QID) | RESPIRATORY_TRACT | Status: DC | PRN
  Filled 2016-09-19: qty 6.7

## 2016-09-19 MED ORDER — PROMETHAZINE HCL 25 MG/ML IJ SOLN: 6.2500 mg | INTRAMUSCULAR | Status: DC | PRN

## 2016-09-19 MED ORDER — ONDANSETRON HCL 4 MG/2ML IJ SOLN
4.0000 mg | Freq: Four times a day (QID) | INTRAMUSCULAR | Status: DC | PRN
  Filled 2016-09-19: qty 2

## 2016-09-19 MED ORDER — MUPIROCIN 2 % EX OINT
1.0000 "application " | TOPICAL_OINTMENT | Freq: Once | CUTANEOUS | Status: AC
  Administered 2016-09-19: 1 via TOPICAL

## 2016-09-19 MED ORDER — OXYCODONE HCL 5 MG PO TABS
ORAL_TABLET | ORAL | Status: AC
  Filled 2016-09-19: qty 2

## 2016-09-19 MED ORDER — ONDANSETRON HCL 4 MG/2ML IJ SOLN
INTRAMUSCULAR | Status: DC | PRN
  Administered 2016-09-19: 4 mg via INTRAVENOUS

## 2016-09-19 MED ORDER — MIDAZOLAM HCL 2 MG/2ML IJ SOLN
INTRAMUSCULAR | Status: AC
  Filled 2016-09-19: qty 2

## 2016-09-19 MED ORDER — MOMETASONE FURO-FORMOTEROL FUM 200-5 MCG/ACT IN AERO
2.0000 | INHALATION_SPRAY | Freq: Two times a day (BID) | RESPIRATORY_TRACT | Status: DC
  Administered 2016-09-19: 2 via RESPIRATORY_TRACT
  Filled 2016-09-19: qty 8.8

## 2016-09-19 MED ORDER — SODIUM CHLORIDE 0.9 % IV SOLN: Freq: Once | INTRAVENOUS | Status: DC

## 2016-09-19 MED ORDER — SODIUM CHLORIDE 0.9% FLUSH: 9.0000 mL | INTRAVENOUS | Status: DC | PRN

## 2016-09-19 MED ORDER — ROCURONIUM BROMIDE 100 MG/10ML IV SOLN
INTRAVENOUS | Status: DC | PRN
  Administered 2016-09-19 (×2): 50 mg via INTRAVENOUS

## 2016-09-19 MED ORDER — GLYCOPYRROLATE 0.2 MG/ML IV SOSY
PREFILLED_SYRINGE | INTRAVENOUS | Status: DC | PRN
  Administered 2016-09-19: .1 mg via INTRAVENOUS

## 2016-09-19 MED ORDER — SODIUM CHLORIDE 0.9% FLUSH
10.0000 mL | Freq: Two times a day (BID) | INTRAVENOUS | Status: DC
  Administered 2016-09-19 – 2016-09-20 (×2): 10 mL

## 2016-09-19 MED ORDER — PHENYLEPHRINE 40 MCG/ML (10ML) SYRINGE FOR IV PUSH (FOR BLOOD PRESSURE SUPPORT)
PREFILLED_SYRINGE | INTRAVENOUS | Status: DC | PRN
  Administered 2016-09-19: 80 ug via INTRAVENOUS
  Administered 2016-09-19: 120 ug via INTRAVENOUS
  Administered 2016-09-19: 60 ug via INTRAVENOUS

## 2016-09-19 MED ORDER — ACETAMINOPHEN 160 MG/5ML PO SOLN: 1000.0000 mg | Freq: Four times a day (QID) | ORAL | Status: DC

## 2016-09-19 MED ORDER — SODIUM CHLORIDE 0.9% FLUSH: 10.0000 mL | INTRAVENOUS | Status: DC | PRN

## 2016-09-19 MED ORDER — ACETAMINOPHEN 500 MG PO TABS
1000.0000 mg | ORAL_TABLET | Freq: Four times a day (QID) | ORAL | Status: DC
  Administered 2016-09-19 – 2016-09-20 (×4): 1000 mg via ORAL
  Filled 2016-09-19 (×4): qty 2

## 2016-09-19 SURGICAL SUPPLY — 87 items
APPLICATOR TIP COSEAL (VASCULAR PRODUCTS) IMPLANT
APPLICATOR TIP EXT COSEAL (VASCULAR PRODUCTS) IMPLANT
BLADE SURG 11 STRL SS (BLADE) IMPLANT
BRUSH CYTOL CELLEBRITY 1.5X140 (MISCELLANEOUS) IMPLANT
CANISTER SUCT 3000ML PPV (MISCELLANEOUS) ×3 IMPLANT
CATH KIT ON Q 5IN SLV (PAIN MANAGEMENT) IMPLANT
CATH THORACIC 28FR (CATHETERS) IMPLANT
CATH THORACIC 36FR (CATHETERS) IMPLANT
CATH THORACIC 36FR RT ANG (CATHETERS) IMPLANT
CLEANER TIP ELECTROSURG 2X2 (MISCELLANEOUS) ×3 IMPLANT
CLIP TI MEDIUM 6 (CLIP) IMPLANT
CONN ST 1/4X3/8  BEN (MISCELLANEOUS)
CONN ST 1/4X3/8 BEN (MISCELLANEOUS) IMPLANT
CONN Y 3/8X3/8X3/8  BEN (MISCELLANEOUS)
CONN Y 3/8X3/8X3/8 BEN (MISCELLANEOUS) IMPLANT
CONT SPEC 4OZ CLIKSEAL STRL BL (MISCELLANEOUS) ×6 IMPLANT
COVER BACK TABLE 60X90IN (DRAPES) IMPLANT
COVER SURGICAL LIGHT HANDLE (MISCELLANEOUS) ×3 IMPLANT
DERMABOND ADVANCED (GAUZE/BANDAGES/DRESSINGS) ×1
DERMABOND ADVANCED .7 DNX12 (GAUZE/BANDAGES/DRESSINGS) ×2 IMPLANT
DRAIN CHANNEL 28F RND 3/8 FF (WOUND CARE) ×6 IMPLANT
DRAPE LAPAROSCOPIC ABDOMINAL (DRAPES) ×3 IMPLANT
DRAPE PROXIMA HALF (DRAPES) ×3 IMPLANT
DRAPE SLUSH/WARMER DISC (DRAPES) ×3 IMPLANT
DRAPE WARM FLUID 44X44 (DRAPE) IMPLANT
DRILL BIT 7/64X5 (BIT) IMPLANT
ELECT BLADE 4.0 EZ CLEAN MEGAD (MISCELLANEOUS) ×3
ELECT BLADE 6.5 EXT (BLADE) ×3 IMPLANT
ELECT REM PT RETURN 9FT ADLT (ELECTROSURGICAL) ×3
ELECTRODE BLDE 4.0 EZ CLN MEGD (MISCELLANEOUS) ×2 IMPLANT
ELECTRODE REM PT RTRN 9FT ADLT (ELECTROSURGICAL) ×2 IMPLANT
FORCEPS BIOP RJ4 1.8 (CUTTING FORCEPS) IMPLANT
GAUZE SPONGE 4X4 12PLY STRL (GAUZE/BANDAGES/DRESSINGS) ×3 IMPLANT
GLOVE BIO SURGEON STRL SZ 6.5 (GLOVE) ×9 IMPLANT
GOWN STRL REUS W/ TWL LRG LVL3 (GOWN DISPOSABLE) ×8 IMPLANT
GOWN STRL REUS W/TWL LRG LVL3 (GOWN DISPOSABLE) ×4
KIT BASIN OR (CUSTOM PROCEDURE TRAY) ×3 IMPLANT
KIT CLEAN ENDO COMPLIANCE (KITS) ×3 IMPLANT
KIT ROOM TURNOVER OR (KITS) ×3 IMPLANT
KIT SUCTION CATH 14FR (SUCTIONS) ×3 IMPLANT
MARKER SKIN DUAL TIP RULER LAB (MISCELLANEOUS) IMPLANT
NS IRRIG 1000ML POUR BTL (IV SOLUTION) ×12 IMPLANT
OIL SILICONE PENTAX (PARTS (SERVICE/REPAIRS)) ×3 IMPLANT
PACK CHEST (CUSTOM PROCEDURE TRAY) ×3 IMPLANT
PAD ARMBOARD 7.5X6 YLW CONV (MISCELLANEOUS) ×6 IMPLANT
PASSER SUT SWANSON 36MM LOOP (INSTRUMENTS) IMPLANT
SCISSORS LAP 5X35 DISP (ENDOMECHANICALS) IMPLANT
SEALANT PROGEL (MISCELLANEOUS) IMPLANT
SEALANT SURG COSEAL 4ML (VASCULAR PRODUCTS) IMPLANT
SEALANT SURG COSEAL 8ML (VASCULAR PRODUCTS) IMPLANT
SOLUTION ANTI FOG 6CC (MISCELLANEOUS) ×3 IMPLANT
SUT PROLENE 3 0 SH DA (SUTURE) IMPLANT
SUT PROLENE 4 0 RB 1 (SUTURE)
SUT PROLENE 4-0 RB1 .5 CRCL 36 (SUTURE) IMPLANT
SUT SILK  1 MH (SUTURE) ×4
SUT SILK 1 MH (SUTURE) ×8 IMPLANT
SUT SILK 1 TIES 10X30 (SUTURE) IMPLANT
SUT SILK 2 0SH CR/8 30 (SUTURE) IMPLANT
SUT SILK 3 0SH CR/8 30 (SUTURE) IMPLANT
SUT VIC AB 1 CTX 18 (SUTURE) ×3 IMPLANT
SUT VIC AB 1 CTX 36 (SUTURE)
SUT VIC AB 1 CTX36XBRD ANBCTR (SUTURE) IMPLANT
SUT VIC AB 2-0 CT1 27 (SUTURE) ×2
SUT VIC AB 2-0 CT1 TAPERPNT 27 (SUTURE) ×4 IMPLANT
SUT VIC AB 2-0 CTX 36 (SUTURE) IMPLANT
SUT VIC AB 3-0 SH 8-18 (SUTURE) ×3 IMPLANT
SUT VIC AB 3-0 X1 27 (SUTURE) ×3 IMPLANT
SUT VICRYL 0 UR6 27IN ABS (SUTURE) IMPLANT
SUT VICRYL 2 TP 1 (SUTURE) IMPLANT
SWAB COLLECTION DEVICE MRSA (MISCELLANEOUS) IMPLANT
SWAB CULTURE ESWAB REG 1ML (MISCELLANEOUS) IMPLANT
SYR 20ML ECCENTRIC (SYRINGE) ×3 IMPLANT
SYRINGE 20CC LL (MISCELLANEOUS) ×3 IMPLANT
SYSTEM SAHARA CHEST DRAIN ATS (WOUND CARE) ×3 IMPLANT
TAPE CLOTH 4X10 WHT NS (GAUZE/BANDAGES/DRESSINGS) ×3 IMPLANT
TAPE UMBILICAL COTTON 1/8X30 (MISCELLANEOUS) IMPLANT
TIP APPLICATOR SPRAY EXTEND 16 (VASCULAR PRODUCTS) IMPLANT
TOWEL GREEN STERILE (TOWEL DISPOSABLE) IMPLANT
TOWEL GREEN STERILE FF (TOWEL DISPOSABLE) ×3 IMPLANT
TOWEL OR 17X24 6PK STRL BLUE (TOWEL DISPOSABLE) IMPLANT
TOWEL OR 17X26 10 PK STRL BLUE (TOWEL DISPOSABLE) ×3 IMPLANT
TRAP SPECIMEN MUCOUS 40CC (MISCELLANEOUS) ×6 IMPLANT
TRAY FOLEY CATH SILVER 16FR LF (SET/KITS/TRAYS/PACK) ×3 IMPLANT
TROCAR BLADELESS 12MM (ENDOMECHANICALS) IMPLANT
TUBE CONNECTING 20X1/4 (TUBING) ×3 IMPLANT
TUNNELER SHEATH ON-Q 11GX8 DSP (PAIN MANAGEMENT) IMPLANT
WATER STERILE IRR 1000ML POUR (IV SOLUTION) ×6 IMPLANT

## 2016-09-19 NOTE — Anesthesia Procedure Notes (Addendum)
Central Venous Catheter Insertion Performed by: Rica Koyanagi, anesthesiologist Start/End5/30/2018 7:15 AM, 09/19/2016 7:25 AM Patient location: Pre-op. Preanesthetic checklist: patient identified, IV checked, site marked, risks and benefits discussed, surgical consent, monitors and equipment checked, pre-op evaluation and timeout performed Position: Trendelenburg Lidocaine 1% used for infiltration and patient sedated Hand hygiene performed , maximum sterile barriers used  and Seldinger technique used Catheter size: 8.5 Fr Central line was placed.Double lumen Procedure performed using ultrasound guided technique. Ultrasound Notes:anatomy identified, needle tip was noted to be adjacent to the nerve/plexus identified, no ultrasound evidence of intravascular and/or intraneural injection and image(s) printed for medical record Attempts: 1 Following insertion, line sutured, dressing applied and Biopatch. Patient tolerated the procedure well with no immediate complications.

## 2016-09-19 NOTE — Anesthesia Postprocedure Evaluation (Signed)
Anesthesia Post Note  Patient: GERRETT LOMAN  Procedure(s) Performed: Procedure(s) (LRB): VIDEO BRONCHOSCOPY (N/A) LEFT VIDEO ASSISTED THORACOSCOPY (VATS)/DECORTICATION (Left)  Patient location during evaluation: PACU Anesthesia Type: General Level of consciousness: awake and sedated Pain management: pain level controlled Vital Signs Assessment: post-procedure vital signs reviewed and stable Respiratory status: spontaneous breathing, nonlabored ventilation, respiratory function stable and patient connected to nasal cannula oxygen Cardiovascular status: blood pressure returned to baseline and stable Postop Assessment: no signs of nausea or vomiting Anesthetic complications: no       Last Vitals:  Vitals:   09/19/16 1130 09/19/16 1145  BP:    Pulse: 78 81  Resp: 12 15  Temp:      Last Pain:  Vitals:   09/19/16 1130  TempSrc:   PainSc: Asleep                 Kushal Saunders,JAMES TERRILL

## 2016-09-19 NOTE — Anesthesia Procedure Notes (Addendum)
Procedure Name: Intubation Date/Time: 09/19/2016 8:07 AM Performed by: Mervyn Gay Pre-anesthesia Checklist: Patient identified, Patient being monitored, Timeout performed, Emergency Drugs available and Suction available Patient Re-evaluated:Patient Re-evaluated prior to inductionOxygen Delivery Method: Circle System Utilized Preoxygenation: Pre-oxygenation with 100% oxygen Intubation Type: Inhalational induction with existing ETT Laryngoscope Size: [place]shaw and 3 Grade View: Grade I Tube type: Oral Endobronchial tube: Left, Double lumen EBT, EBT position confirmed by fiberoptic bronchoscope and EBT position confirmed by auscultation and 41 Fr Number of attempts: 1 Airway Equipment and Method: Stylet,  Video-laryngoscopy and Fiberoptic brochoscope Placement Confirmation: ETT inserted through vocal cords under direct vision,  positive ETCO2 and breath sounds checked- equal and bilateral Secured at: 31 cm Tube secured with: Tape Dental Injury: Teeth and Oropharynx as per pre-operative assessment

## 2016-09-19 NOTE — Anesthesia Procedure Notes (Signed)
Procedure Name: Intubation Date/Time: 09/19/2016 7:45 AM Performed by: Mervyn Gay Pre-anesthesia Checklist: Patient identified, Patient being monitored, Timeout performed, Emergency Drugs available and Suction available Patient Re-evaluated:Patient Re-evaluated prior to inductionOxygen Delivery Method: Circle System Utilized Preoxygenation: Pre-oxygenation with 100% oxygen Intubation Type: IV induction Ventilation: Mask ventilation without difficulty Laryngoscope Size: Miller and 2 Grade View: Grade I Tube type: Oral Tube size: 9.0 mm Number of attempts: 1 Airway Equipment and Method: Stylet Placement Confirmation: ETT inserted through vocal cords under direct vision,  positive ETCO2 and breath sounds checked- equal and bilateral Secured at: 22 cm Tube secured with: Tape Dental Injury: Teeth and Oropharynx as per pre-operative assessment

## 2016-09-19 NOTE — Brief Op Note (Addendum)
      [address]       [place] [address]             [phone]      09/19/2016  9:31 AM  PATIENT:  Brent Gonzales  71 y.o. male  PRE-OPERATIVE DIAGNOSIS:  LEFT PLEURAL EFFUSION  POST-OPERATIVE DIAGNOSIS:  LEFT PLEURAL EFFUSION  PROCEDURE:  Procedure(s): VIDEO BRONCHOSCOPY (N/A) LEFT VIDEO ASSISTED THORACOSCOPY (VATS)/DECORTICATION (Left)  SURGEON:  Surgeon(s) and Role:    * Grace Isaac, MD - Primary  PHYSICIAN ASSISTANT:  Nicholes Rough, PA-C   ANESTHESIA:   general  EBL:  Total I/O In: -  Out: 575 [Urine:425; Blood:150]  BLOOD ADMINISTERED:none  DRAINS: two blake drains   LOCAL MEDICATIONS USED:  NONE  SPECIMEN:  Source of Specimen:  left pleural fluid and pleural peel  DISPOSITION OF SPECIMEN:  PATHOLOGY  COUNTS:  YES   DICTATION: .Dragon Dictation  PLAN OF CARE: Admit to inpatient   PATIENT DISPOSITION:  ICU - extubated and stable.   Delay start of Pharmacological VTE agent (>24hrs) due to surgical blood loss or risk of bleeding: yes

## 2016-09-19 NOTE — Progress Notes (Signed)
Patient unable to provide urine sample.  Josh, CRNA stated that he will collect when patient is catheterized.

## 2016-09-19 NOTE — Anesthesia Preprocedure Evaluation (Addendum)
Anesthesia Evaluation  Patient identified by MRN, date of birth, ID band Patient awake    Airway Mallampati: II   Neck ROM: Full    Dental no notable dental hx.    Pulmonary pneumonia, COPD, former smoker,    breath sounds clear to auscultation       Cardiovascular hypertension, + CAD and + Peripheral Vascular Disease   Rhythm:Regular Rate:Normal     Neuro/Psych    GI/Hepatic negative GI ROS, Neg liver ROS,   Endo/Other  negative endocrine ROS  Renal/GU negative Renal ROS     Musculoskeletal   Abdominal   Peds  Hematology negative hematology ROS (+)   Anesthesia Other Findings   Reproductive/Obstetrics                            Anesthesia Physical Anesthesia Plan  ASA: III  Anesthesia Plan: General   Post-op Pain Management:    Induction: Intravenous  Airway Management Planned: Double Lumen EBT  Additional Equipment: Arterial line, CVP and Ultrasound Guidance Line Placement  Intra-op Plan:   Post-operative Plan: Extubation in OR and Possible Post-op intubation/ventilation  Informed Consent: I have reviewed the patients History and Physical, chart, labs and discussed the procedure including the risks, benefits and alternatives for the proposed anesthesia with the patient or authorized representative who has indicated his/her understanding and acceptance.     Plan Discussed with: CRNA  Anesthesia Plan Comments:        Anesthesia Quick Evaluation

## 2016-09-19 NOTE — Progress Notes (Signed)
Dr. Servando Snare stated that chest xray is not needed day of surgery.

## 2016-09-19 NOTE — H&P (Signed)
[name]       [name] [address]             [phone]                    [name] [place] Medical Record #[id number] Date of Birth: [birth year]  Referring: r Wert Primary Care: [name], MD  Chief Complaint:    Left pleural effusion    History of Present Illness:    Brent Gonzales 71 y.o. male is seen in the office  Today. Patient is followed in the pulmonary office for long-term smoking and COPD, quit smoking  February 2018.  He noted onset of fever chills and cough and left pleuritic chest pain. He was diagnosed with community-acquired pneumonia, a CT scan of the abdomen was performed that showed left lower lobe consolidation and a small effusion. He notes that a course of a week of by mouth antibiotics his symptoms have improved. Over the second last several days his pleuritic pain is better. Attempted thoracentesis only removed 100 mL of straw-colored fluid on 5/ 18. The patient is referred from the pulmonary office for consideration of left VATS. The patient denies any occupational asbestos exposure that he is aware of, worked primarily as a Ecologist.   Current Activity/ Functional Status:  Patient is independent with mobility/ambulation, transfers, ADL's, IADL's.   Zubrod Score: At the time of surgery this patient's most appropriate activity status/level should be described as: []     0    Normal activity, no symptoms [x]     1    Restricted in physical strenuous activity but ambulatory, able to do out light work []     2    Ambulatory and capable of self care, unable to do work activities, up and about               >50 % of waking hours                              []     3    Only limited self care, in bed greater than 50% of waking hours []     4    Completely disabled, no self care, confined to bed or chair []     5    Moribund   Past Medical History:  Diagnosis Date  . Aorto-iliac disease ([place])    mild  bilateral external. ABIs in the 0.9 range  . Carotid artery disease ([place])    right ICA stenosis  . COPD (chronic obstructive pulmonary disease) ([place])   . Coronary artery disease   . High cholesterol   . Hyperlipidemia   . Hypertension    no longer- on medications  . Pneumonia 08/2016  . Tobacco abuse     Past Surgical History:  Procedure Laterality Date  . COLONOSCOPY W/ POLYPECTOMY    . DOPPLER ECHOCARDIOGRAPHY    . Duplex ultrasound    . HERNIA REPAIR     left and right  . NM MYOVIEW LTD  03/2010   Inferior septal ischemia  . stress myocardial perfusion    . TONSILLECTOMY AND ADENOIDECTOMY      History reviewed. No pertinent family history.  Social History   Social History  . Marital status: Married    Spouse name: N/A  . Number of children: N/A  . Years of education: N/A   Occupational  History  . 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 Smoking status: Former Smoker    Packs/day: 0.50    Years: 45.00    Quit date: 08/21/2016  . Smokeless tobacco: Never Used  . Alcohol use 0.6 oz/week    1 Cans of beer per week  . Drug use: No  . Sexual activity: Yes    History  Smoking Status  . Former Smoker  . Packs/day: 0.50  . Years: 45.00  . Quit date: 08/21/2016  Smokeless Tobacco  . Never Used    History  Alcohol Use  . 4.2 oz/week  . 7 Cans of beer per week     Allergies  Allergen Reactions  . Penicillins Other (See Comments)    Family history if this allergy Has patient had a PCN reaction causing immediate rash, facial/tongue/throat swelling, SOB or lightheadedness with hypotension: No Has patient had a PCN reaction causing severe rash involving mucus membranes or skin necrosis: No Has patient had a PCN reaction that required hospitalization: No Has patient had a PCN reaction occurring within the last 10 years: No If all of the above answers are "NO", then may proceed with Cephalosporin use.      Current Facility-Administered Medications  Medication Dose Route Frequency Provider Last Rate Last Dose  . 0.9 %  sodium chloride infusion   Intravenous Once [place], Dereck Leep, CRNA      . vancomycin (VANCOCIN) IVPB 1000 mg/200 mL premix  1,000 mg Intravenous On Call to OR Grace Isaac, MD         Review of Systems:     Cardiac Review of Systems: Y or N  Chest Pain [  y  ]  Resting SOB [  y] Exertional SOB  [ y ]  Vertell Limber Florencio.Farrier ]   Pedal Edema [n   ]    Palpitations Florencio.Farrier  ] Syncope  Florencio.Farrier  ]   Presyncope [ n  ]  General Review of Systems: [Y] = yes [  ]=no Constitional: recent weight change [ n ];  Wt loss over the last 3 months [   ] anorexia [  ]; fatigue [  ]; nausea [  ]; night sweats [  ]; fever [  ]; or chills [  ];          Dental: poor dentition[  ]; Last Dentist visit:   Eye : blurred vision [  ]; diplopia [   ]; vision changes [  ];  Amaurosis fugax[  ]; Resp: cough Blue.Reese  ];  wheezing[y  ];  hemoptysis[ n ]; shortness of breath[ y ]; paroxysmal nocturnal dyspnea[ y ]; dyspnea on exertion[y  ]; or orthopnea[y  ];  GI:  gallstones[  ], vomiting[  ];  dysphagia[  ]; melena[  ];  hematochezia [  ]; heartburn[  ];   Hx of  Colonoscopy[  ]; GU: kidney stones [  ]; hematuria[  ];   dysuria [  ];  nocturia[  ];  history of     obstruction [  ]; urinary frequency [  ]             Skin: rash, swelling[  ];, hair loss[  ];  peripheral edema[  ];  or itching[  ]; Musculosketetal: myalgias[ y ];  joint swelling[  ];  joint erythema[  ];  joint pain[  ];  back pain[  ];  Heme/Lymph:  bruising[  ];  bleeding[  ];  anemia[  ];  Neuro: TIA[  ];  headaches[  ];  stroke[  ];  vertigo[  ];  seizures[ n ];   paresthesias[  ];  difficulty walking[ n ];  Psych:depression[  ]; anxiety[  ];  Endocrine: diabetes[  ];  thyroid dysfunction[  ];  Immunizations: Flu up to date [ y ]; Pneumococcal up to date Blue.Reese  ];  Other:  Physical Exam: BP (!) 163/56   Pulse 76   Temp 98 F (36.7 C) (Oral)    Resp 20   SpO2 94%   PHYSICAL EXAMINATION:  General appearance: alert, cooperative and no distress Head: Normocephalic, without obvious abnormality, atraumatic Neck: no adenopathy, no carotid bruit, no JVD, supple, symmetrical, trachea midline and thyroid not enlarged, symmetric, no tenderness/mass/nodules Lymph nodes: Cervical, supraclavicular, and axillary nodes normal. Resp: diminished breath sounds LLL and RLL  Back: symmetric, no curvature. ROM normal. No CVA tenderness. Cardio: regular rate and rhythm, S1, S2 normal, no murmur, click, rub or gallop GI: soft, non-tender; bowel sounds normal; no masses,  no organomegaly Extremities: extremities normal, atraumatic, no cyanosis or edema and Homans sign is negative, no sign of DVT Neurologic: Grossly normal    Diagnostic Studies & Laboratory data:     Recent Radiology Findings:  Ct Chest Wo Contrast  Result Date: 09/14/2016 CLINICAL DATA:  Pleural effusion with thoracentesis. Pneumonia. Prior smoker. EXAM: CT CHEST WITHOUT CONTRAST TECHNIQUE: Multidetector CT imaging of the chest was performed following the standard protocol without IV contrast. COMPARISON:  Chest radiograph 09/13/2016, CT abdomen 08/28/2016 FINDINGS: Cardiovascular: Coronary artery calcification and aortic atherosclerotic calcification. Mediastinum/Nodes: No axillary or supraclavicular adenopathy. 13 mm LEFT lower paratracheal lymph node. No supraclavicular adenopathy. Similar mild subcarinal adenopathy with a 13 mm. Lungs/Pleura: There is loculated pleural fluid in the lower LEFT hemithorax. There is atelectasis of the LEFT lower lobe. No clear empyema or pulmonary abscess. LEFT upper lobe is clear. In comparison to CT of 08/28/2016 there is improvement in airspace disease in LEFT lower lobe but increased pleural fluid. RIGHT upper lobe and RIGHT lower lobe are clear. Centrilobular emphysema the upper lobes. Upper Abdomen: Limited view of the liver, kidneys, pancreas are  unremarkable. Normal adrenal glands. Granulomata within the spleen. Musculoskeletal: No aggressive osseous lesion. IMPRESSION: 1. Loculated pleural fluid in the LEFT hemithorax with LEFT lower lobe atelectasis. No clear evidence of empyema or pulmonary abscess. Improvement in the pneumonia pattern in the LEFT lower lobe seen on comparison CT. However, effusion is increased. 2. Upper lobe centrilobular emphysema. 3. Mild mediastinal lymphadenopathy is favored reactive. Electronically Signed   By: Suzy Bouchard M.D.   On: 09/14/2016 15:43    Dg Chest 2 View  Result Date: 09/13/2016 CLINICAL DATA:  Follow-up left pleural effusion. EXAM: CHEST  2 VIEW COMPARISON:  09/07/2016 FINDINGS: Cardiomediastinal silhouette is unchanged. Moderate left pleural effusion has decreased from the prior study. Associated left lower lung atelectasis/ consolidation again noted. The right lung is clear. There is no evidence of pneumothorax. IMPRESSION: Decreased moderate left pleural effusion with continued associated left lower lung atelectasis/ consolidation. Electronically Signed   By: Margarette Canada M.D.   On: 09/13/2016 14:52   Dg Chest 2 View  Result Date: 09/07/2016 CLINICAL DATA:  Shortness of breath and recent diagnosis of left lower lobe pneumonia. EXAM: CHEST  2 VIEW COMPARISON:  08/28/2016 CT, chest radiograph and prior studies. FINDINGS: Increasing moderate to large left pleural effusion and/or mid-lower left lung consolidation  noted. The right lung is clear. There is no evidence of right pleural effusion. No pneumothorax. No other changes identified. IMPRESSION: Increasing moderate to large left pleural effusion and/or mid-lower left lung consolidation. Electronically Signed   By: Margarette Canada M.D.   On: 09/07/2016 11:43   Dg Chest 2 View  Result Date: 08/28/2016 CLINICAL DATA:  One day history of cough, shortness of breath, lower left-sided chest pain as well as left shoulder and neck pain and fever. History of  COPD, coronary artery disease, current smoker. EXAM: CHEST  2 VIEW COMPARISON:  PA and lateral chest x-ray of June 13, 2016. FINDINGS: There is new increased density at the left lung base. This is consistent with atelectasis or infiltrate with small pleural effusion. The right lung base is clear. The heart and pulmonary vascularity are normal. The mediastinum is normal in width. There is calcification in the wall of the aortic arch. The bony thorax is unremarkable. IMPRESSION: New left lower lobe atelectasis or pneumonia with small left pleural effusion. Underlying COPD. Followup PA and lateral chest X-ray is recommended in 3-4 weeks following trial of antibiotic therapy to ensure resolution and exclude underlying malignancy. Thoracic aortic atherosclerosis. Electronically Signed   By: David  Martinique M.D.   On: 08/28/2016 14:52   Ct Abdomen Pelvis W Contrast  Result Date: 08/28/2016 CLINICAL DATA:  Left upper quadrant abdominal pain for the past week with intermittent fever. EXAM: CT ABDOMEN AND PELVIS WITH CONTRAST TECHNIQUE: Multidetector CT imaging of the abdomen and pelvis was performed using the standard protocol following bolus administration of intravenous contrast. CONTRAST:  15mL ISOVUE-300 IOPAMIDOL (ISOVUE-300) INJECTION 61% COMPARISON:  Abdomen pelvis radiographs dated 07/13/2013. FINDINGS: Lower chest: Bilateral lower lobe airspace opacity and volume loss, greater on the left. Small left pleural effusion. Hepatobiliary: Small calcified granulomata in the liver. Normal appearing gallbladder. Pancreas: Unremarkable. No pancreatic ductal dilatation or surrounding inflammatory changes. Spleen: Multiple small calcified granulomata. Normal in size and shape. Adrenals/Urinary Tract: Small upper pole left renal cortical calcification. No urinary tract calculi or hydronephrosis. Normal appearing adrenal glands. Stomach/Bowel: Large number of sigmoid colon diverticula without evidence of diverticulitis.  Normal appearing appendix, small bowel and stomach. Vascular/Lymphatic: Atheromatous arterial calcifications and plaques without aneurysm. Mildly enlarged lymph node at the root of the mesenteries on the left, with a short axis diameter of 11 mm on image number 26 of series 3. There is also a mildly enlarged peripancreatic/gastrohepatic ligament node with a short axis diameter of 11 mm on image number 31 of series 3. There are additional borderline enlarged lymph nodes in the upper abdomen. No retroperitoneal adenopathy. Reproductive: Normal sized prostate gland. Other: No abdominal wall hernia or abnormality. No abdominopelvic ascites. Musculoskeletal: Lumbar and lower thoracic spine degenerative changes and straightening of the normal lumbar lordosis. Mild dextroconvex thoracolumbar scoliosis. IMPRESSION: 1. Left lower lobe pneumonia and atelectasis with a small left pleural effusion. 2. Milder right lower lobe atelectasis and possible pneumonia. 3. Mild upper abdominal adenopathy, most likely reactive in the absence of a visible neoplasm. Electronically Signed   By: Claudie Revering M.D.   On: 08/28/2016 15:27   US Thoracentesis Asp Pleural Space W/img Guide  Result Date: 09/07/2016 INDICATION: Cough. Shortness of breath. Loculated left pleural effusion. Request for diagnostic and therapeutic thoracentesis. EXAM: ULTRASOUND GUIDED LEFT THORACENTESIS MEDICATIONS: 1% Lidocaine = 10 mL. COMPLICATIONS: None immediate. PROCEDURE: An ultrasound guided thoracentesis was thoroughly discussed with the patient and questions answered. The benefits, risks, alternatives and complications were also discussed. The  patient understands and wishes to proceed with the procedure. Written consent was obtained. Ultrasound was performed to localize and mark an adequate pocket of fluid in the left chest. The area was then prepped and draped in the normal sterile fashion. 1% Lidocaine was used for local anesthesia. Under ultrasound  guidance a 6 Fr Safe-T-Centesis catheter was introduced. Thoracentesis was performed. The catheter was removed and a dressing applied. FINDINGS: A total of approximately 100 mL of clear yellow fluid was removed. Unable to remove more secondary to effusion being loculated. Samples were sent to the laboratory as requested by the clinical team. IMPRESSION: Successful ultrasound guided left thoracentesis yielding 100 mL of pleural fluid. Read by:  Gareth Eagle, PA-C Electronically Signed   By: Jacqulynn Cadet M.D.   On: 09/07/2016 14:19     I have independently reviewed the above radiologic studies.  Recent Lab Findings: Lab Results  Component Value Date   WBC 12.9 (H) 09/07/2016   HGB 12.5 (L) 09/07/2016   HCT 37.8 (L) 09/07/2016   PLT 519.0 (H) 09/07/2016   GLUCOSE 122 (H) 09/03/2016   CHOL 124 (L) 03/31/2015   TRIG 123 03/31/2015   HDL 32 (L) 03/31/2015   LDLCALC 67 03/31/2015   ALT 69 (H) 09/03/2016   AST 40 (H) 09/03/2016   NA 139 09/03/2016   K 4.5 09/03/2016   CL 107 09/03/2016   CREATININE 1.22 (H) 09/03/2016   BUN 19 09/03/2016   CO2 23 09/03/2016   TSH 2.254 06/21/2014   PFT"S 07/24/2016 FEV1  2.11  56% DLCO 16.42  43 %  Interpretation: The FVC, FEV1, FEV1/FVC ratio and FEF25-75% are reduced indicating airway obstruction. The SVC is reduced, but the TLC is within normal limits. Following administration of bronchodilators, there is no significant response. The reduced diffusing capacity indicates a severe loss of functional alveolar capillary surface. However, the diffusing capacity was not corrected for the patient's hemoglobin. Pulmonary Function Diagnosis: Moderate Obstructive Airways Disease Severe Diffusion Defect   Assessment / Plan:   1/  Left lower lobe pneumonia and atelectasis with a small left pleural effusion, Patient with community-acquired pneumonia treated with by mouth antibiotics for 7 days, with gradually resolving symptoms.  With decreasing  parenchymal involvement and increasing pleural fluid and pleural reaction I recommended to the patient that we proceed with bronchoscopy left video-assisted thoracoscopy decortication with pleural biopsy in order to prevent developing long-term entrapped lung. The patient has no previous asbestos exposure, but will obtain pathologic biopsies of pleural tissue, along with cultures. Risks and options and rationale of proceeding with surgical intervention were discussed with the patient and his wife in detail, he is  willing to proceed. He's currently on Plavix, he has no known documented stents in place. He saw Dr. Gwenlyn Found 2 weeks ago. Plan for surgery May 30.   2/ chronic obstructive pulmonary disease, and long-term smoker who is quit several months ago.  -Moderate Obstructive Airways Disease Severe Diffusion Defect  The goals risks and alternatives of the planned surgical procedure Procedure(s): VIDEO BRONCHOSCOPY (N/A) VIDEO ASSISTED THORACOSCOPY (VATS)/DECORTICATION (Left) PLEURAL BIOPSY (N/A)  have been discussed with the patient in detail. The risks of the procedure including death, infection, stroke, myocardial infarction, bleeding, blood transfusion have all been discussed specifically.  I have quoted Brent Gonzales a 2 % of perioperative mortality and a complication rate as high as 30%. The patient's questions have been answered.Brent Gonzales is willing  to proceed with the planned procedure. Grace Isaac MD  [address] [place] [address] Office [phone]   Beeper [phone]  09/19/2016 6:56 AM

## 2016-09-19 NOTE — Progress Notes (Signed)
[facility] BRIEF SICU PROGRESS NOTE  Day of Surgery  S/P Procedure(s) (LRB): VIDEO BRONCHOSCOPY (N/A) LEFT VIDEO ASSISTED THORACOSCOPY (VATS)/DECORTICATION (Left)   Adequate analgesia Breathing comfortably w/ O2 sats 97% on 4 L/min NSR w/ stable BP Chest tube output low  Plan: Continue routine early postop  Rexene Alberts, MD 09/19/2016 6:27 PM

## 2016-09-19 NOTE — Transfer of Care (Signed)
Immediate Anesthesia Transfer of Care Note  Patient: Brent Gonzales  Procedure(s) Performed: Procedure(s): VIDEO BRONCHOSCOPY (N/A) LEFT VIDEO ASSISTED THORACOSCOPY (VATS)/DECORTICATION (Left)  Patient Location: PACU  Anesthesia Type:General  Level of Consciousness: awake, alert , oriented and patient cooperative  Airway & Oxygen Therapy: Patient Spontanous Breathing and Patient connected to face mask oxygen  Post-op Assessment: Report given to RN, Post -op Vital signs reviewed and stable and Patient moving all extremities X 4  Post vital signs: Reviewed and stable  Last Vitals:  Vitals:   09/19/16 0725 09/19/16 0726  BP:    Pulse: 73 74  Resp: 16 19  Temp:      Last Pain:  Vitals:   09/19/16 0635  TempSrc: Oral      Patients Stated Pain Goal: 2 (76/16/07 3710)  Complications: No apparent anesthesia complications

## 2016-09-20 ENCOUNTER — Inpatient Hospital Stay (HOSPITAL_COMMUNITY): Payer: Medicare Other

## 2016-09-20 ENCOUNTER — Encounter (HOSPITAL_COMMUNITY): Payer: Self-pay | Admitting: Cardiothoracic Surgery

## 2016-09-20 LAB — BLOOD GAS, ARTERIAL
Acid-Base Excess: 3.2 mmol/L — ABNORMAL HIGH (ref 0.0–2.0)
Bicarbonate: 27.1 mmol/L (ref 20.0–28.0)
Drawn by: 244871
O2 Content: 2 L/min
O2 Saturation: 97.3 %
PCO2 ART: 40.2 mmHg (ref 32.0–48.0)
PH ART: 7.443 (ref 7.350–7.450)
PO2 ART: 96.6 mmHg (ref 83.0–108.0)
Patient temperature: 98.1

## 2016-09-20 LAB — CBC
HEMATOCRIT: 33.6 % — AB (ref 39.0–52.0)
Hemoglobin: 10.7 g/dL — ABNORMAL LOW (ref 13.0–17.0)
MCH: 30.5 pg (ref 26.0–34.0)
MCHC: 31.8 g/dL (ref 30.0–36.0)
MCV: 95.7 fL (ref 78.0–100.0)
Platelets: 357 10*3/uL (ref 150–400)
RBC: 3.51 MIL/uL — ABNORMAL LOW (ref 4.22–5.81)
RDW: 12.9 % (ref 11.5–15.5)
WBC: 9.2 10*3/uL (ref 4.0–10.5)

## 2016-09-20 LAB — BASIC METABOLIC PANEL
ANION GAP: 7 (ref 5–15)
BUN: 9 mg/dL (ref 6–20)
CHLORIDE: 105 mmol/L (ref 101–111)
CO2: 28 mmol/L (ref 22–32)
Calcium: 8.6 mg/dL — ABNORMAL LOW (ref 8.9–10.3)
Creatinine, Ser: 0.85 mg/dL (ref 0.61–1.24)
GFR calc Af Amer: >60 mL/min (ref >60)
Glucose, Bld: 115 mg/dL — ABNORMAL HIGH (ref 65–99)
POTASSIUM: 3.6 mmol/L (ref 3.5–5.1)
Sodium: 140 mmol/L (ref 135–145)

## 2016-09-20 LAB — GLUCOSE, CAPILLARY
Glucose-Capillary: 107 mg/dL — ABNORMAL HIGH (ref 65–99)
Glucose-Capillary: 106 mg/dL — ABNORMAL HIGH (ref 65–99)

## 2016-09-20 LAB — ACID FAST SMEAR (AFB, MYCOBACTERIA): Acid Fast Smear: NEGATIVE

## 2016-09-20 MED ORDER — ONDANSETRON HCL 4 MG/2ML IJ SOLN: 4.0000 mg | Freq: Four times a day (QID) | INTRAMUSCULAR | Status: DC | PRN

## 2016-09-20 MED ORDER — BISACODYL 10 MG RE SUPP
10.0000 mg | Freq: Every day | RECTAL | Status: DC | PRN
Start: 2016-09-20 — End: 2016-09-23

## 2016-09-20 MED ORDER — MOVING RIGHT ALONG BOOK
Freq: Once | Status: DC
  Filled 2016-09-20: qty 1

## 2016-09-20 MED ORDER — SODIUM CHLORIDE 0.9 % IV SOLN: 250.0000 mL | INTRAVENOUS | Status: DC | PRN

## 2016-09-20 MED ORDER — BISACODYL 5 MG PO TBEC
10.0000 mg | DELAYED_RELEASE_TABLET | Freq: Every day | ORAL | Status: DC | PRN
  Administered 2016-09-21: 10 mg via ORAL
  Filled 2016-09-20: qty 2

## 2016-09-20 MED ORDER — POTASSIUM CHLORIDE 10 MEQ/50ML IV SOLN: 10.0000 meq | INTRAVENOUS | Status: DC

## 2016-09-20 MED ORDER — SODIUM CHLORIDE 0.9% FLUSH
3.0000 mL | Freq: Two times a day (BID) | INTRAVENOUS | Status: DC
  Administered 2016-09-20 – 2016-09-22 (×6): 3 mL via INTRAVENOUS

## 2016-09-20 MED ORDER — SODIUM CHLORIDE 0.9% FLUSH: 3.0000 mL | INTRAVENOUS | Status: DC | PRN

## 2016-09-20 MED ORDER — ONDANSETRON HCL 4 MG PO TABS: 4.0000 mg | ORAL_TABLET | Freq: Four times a day (QID) | ORAL | Status: DC | PRN

## 2016-09-20 MED ORDER — CLOPIDOGREL BISULFATE 75 MG PO TABS
75.0000 mg | ORAL_TABLET | Freq: Every day | ORAL | Status: DC
  Administered 2016-09-20 – 2016-09-23 (×4): 75 mg via ORAL
  Filled 2016-09-20 (×4): qty 1

## 2016-09-20 NOTE — Progress Notes (Signed)
Patient ID: Brent Gonzales, male   DOB: April 11, 1946, 71 y.o.   MRN: [id number] TCTS DAILY ICU PROGRESS NOTE                   [address]            [place] [address]          [phone]   1 Day Post-Op Procedure(s) (LRB): VIDEO BRONCHOSCOPY (N/A) LEFT VIDEO ASSISTED THORACOSCOPY (VATS)/DECORTICATION (Left)  Total Length of Stay:  LOS: 1 day   Subjective: Feels ok, breathing well, pain well controlled   Objective: Vital signs in last 24 hours: Temp:  [97.8 F (36.6 C)-98.1 F (36.7 C)] 98.1 F (36.7 C) (05/31 0403) Pulse Rate:  [67-86] 69 (05/31 0700) Cardiac Rhythm: Normal sinus rhythm (05/31 0400) Resp:  [12-20] 15 (05/31 0700) BP: (115-158)/(56-88) 115/88 (05/31 0700) SpO2:  [94 %-99 %] 97 % (05/31 0700) Arterial Line BP: (123-171)/(51-98) 125/51 (05/30 1100) FiO2 (%):  [10 %-16 %] 16 % (05/30 1112) Weight:  [177 lb 14.6 oz (80.7 kg)] 177 lb 14.6 oz (80.7 kg) (05/31 0600)  Filed Weights   09/19/16 0705 09/20/16 0600  Weight: 191 lb (86.6 kg) 177 lb 14.6 oz (80.7 kg)    Weight change:    Hemodynamic parameters for last 24 hours:    Intake/Output from previous day: 05/30 0701 - 05/31 0700 In: 3791.7 [P.O.:480; I.V.:3311.7] Out: 5260 [Urine:4875; Blood:150; Chest Tube:235]  Intake/Output this shift: No intake/output data recorded.  Current Meds: Scheduled Meds: . acetaminophen  1,000 mg Oral Q6H   Or  . acetaminophen (TYLENOL) oral liquid 160 mg/5 mL  1,000 mg Oral Q6H  . aspirin EC  81 mg Oral Daily  . atorvastatin  40 mg Oral q1800  . bisacodyl  10 mg Oral Daily  . Chlorhexidine Gluconate Cloth  6 each Topical Daily  . enoxaparin (LOVENOX) injection  40 mg Subcutaneous Daily  . fentaNYL   Intravenous Q4H  . insulin aspart  0-24 Units Subcutaneous Q4H  . mouth rinse  15 mL Mouth Rinse BID  . mometasone-formoterol  2 puff Inhalation BID  . pantoprazole  40 mg Oral Daily  . senna-docusate  1 tablet Oral QHS  . sodium chloride flush   10-40 mL Intracatheter Q12H   Continuous Infusions: . dextrose 5 % and 0.45% NaCl 100 mL/hr at 09/19/16 2353  . potassium chloride    . potassium chloride     PRN Meds:.albuterol, diphenhydrAMINE **OR** diphenhydrAMINE, naloxone **AND** sodium chloride flush, ondansetron (ZOFRAN) IV, oxyCODONE, potassium chloride, sodium chloride flush, traMADol  General appearance: alert and cooperative Neurologic: intact Heart: regular rate and rhythm, S1, S2 normal, no murmur, click, rub or gallop Lungs: diminished breath sounds LLL Abdomen: soft, non-tender; bowel sounds normal; no masses,  no organomegaly Extremities: extremities normal, atraumatic, no cyanosis or edema and Homans sign is negative, no sign of DVT Wound: no air leak   Lab Results: CBC: Recent Labs  09/19/16 0656 09/20/16 0408  WBC 8.6 9.2  HGB 11.3* 10.7*  HCT 34.8* 33.6*  PLT 380 357   BMET:  Recent Labs  09/19/16 0656 09/20/16 0408  NA 138 140  K 3.9 3.6  CL 106 105  CO2 24 28  GLUCOSE 101* 115*  BUN 13 9  CREATININE 0.93 0.85  CALCIUM 8.9 8.6*    CMET: Lab Results  Component Value Date   WBC 9.2 09/20/2016   HGB 10.7 (L) 09/20/2016   HCT 33.6 (L) 09/20/2016  PLT 357 09/20/2016   GLUCOSE 115 (H) 09/20/2016   CHOL 124 (L) 03/31/2015   TRIG 123 03/31/2015   HDL 32 (L) 03/31/2015   LDLCALC 67 03/31/2015   ALT 25 09/19/2016   AST 19 09/19/2016   NA 140 09/20/2016   K 3.6 09/20/2016   CL 105 09/20/2016   CREATININE 0.85 09/20/2016   BUN 9 09/20/2016   CO2 28 09/20/2016   TSH 2.254 06/21/2014   PSA 0.71 06/21/2014   INR 1.17 09/19/2016      PT/INR:  Recent Labs  09/19/16 0656  LABPROT 14.9  INR 1.17   Radiology: Dg Chest Port 1 View  Result Date: 09/19/2016 CLINICAL DATA:  Status post left-sided VATS procedure. Central catheter placement EXAM: PORTABLE CHEST 1 VIEW COMPARISON:  Chest radiograph Sep 13, 2016; chest CT Sep 14, 2016 FINDINGS: There is postoperative change on the left with 2  chest tubes present. There is a central catheter with tip in superior vena cava. There is soft tissue air on the left inferolaterally. No pneumothorax evident, however. There is airspace consolidation in the left base with small residual left pleural effusion. Right lung is clear. Heart size is within normal limits. Pulmonary vascularity normal. There is aortic atherosclerosis. No adenopathy. IMPRESSION: Tube and catheter positions as described without evident pneumothorax. Left lower lobe consolidation, largely due to atelectasis, with small left pleural effusion. Subcutaneous air on the left inferolaterally. There is aortic atherosclerosis. Cardiac silhouette stable. Electronically Signed   By: Lowella Grip III M.D.   On: 09/19/2016 10:36     Assessment/Plan: S/P Procedure(s) (LRB): VIDEO BRONCHOSCOPY (N/A) LEFT VIDEO ASSISTED THORACOSCOPY (VATS)/DECORTICATION (Left) Mobilize Diuresis Plan for transfer to step-down: see transfer orders Expected Acute  Blood - loss Anemia Ct to water seal, start removing tubes in am Path pending       Grace Isaac 09/20/2016 8:11 AM

## 2016-09-20 NOTE — Progress Notes (Signed)
16 cc fentanyl from pca syringe (40 mcg/mL) wasted In sink followed by water flush by Ezequiel Kayser and [employer] RNs.

## 2016-09-20 NOTE — Progress Notes (Signed)
CARDIAC REHAB PHASE I   PRE:  Rate/Rhythm: 84 SR  BP:  Supine:   Sitting: 139/67  Standing:    SaO2: 90%RA  MODE:  Ambulation: 350 ft   POST:  Rate/Rhythm: 100 SR  BP:  Supine: 160/71  Sitting:   Standing:    SaO2: 93%RA 1355-1425 Pt walked 350 ft on RA with steady gait. I carried chest tube. Tolerated well. To bathroom and then to bed. Stated he can feel improvement in breathing.   Graylon Good, RN BSN  09/20/2016 2:20 PM

## 2016-09-21 ENCOUNTER — Inpatient Hospital Stay (HOSPITAL_COMMUNITY): Payer: Medicare Other

## 2016-09-21 LAB — CBC
HCT: 35 % — ABNORMAL LOW (ref 39.0–52.0)
Hemoglobin: 11.2 g/dL — ABNORMAL LOW (ref 13.0–17.0)
MCH: 30.4 pg (ref 26.0–34.0)
MCHC: 32 g/dL (ref 30.0–36.0)
MCV: 95.1 fL (ref 78.0–100.0)
Platelets: 338 10*3/uL (ref 150–400)
RBC: 3.68 MIL/uL — ABNORMAL LOW (ref 4.22–5.81)
RDW: 12.9 % (ref 11.5–15.5)
WBC: 9.6 10*3/uL (ref 4.0–10.5)

## 2016-09-21 LAB — BASIC METABOLIC PANEL
Anion gap: 8 (ref 5–15)
BUN: 14 mg/dL (ref 6–20)
CO2: 27 mmol/L (ref 22–32)
Calcium: 8.7 mg/dL — ABNORMAL LOW (ref 8.9–10.3)
Chloride: 101 mmol/L (ref 101–111)
Creatinine, Ser: 0.89 mg/dL (ref 0.61–1.24)
GFR calc Af Amer: >60 mL/min (ref >60)
GFR calc non Af Amer: >60 mL/min (ref >60)
Glucose, Bld: 104 mg/dL — ABNORMAL HIGH (ref 65–99)
Potassium: 3.7 mmol/L (ref 3.5–5.1)
Sodium: 136 mmol/L (ref 135–145)

## 2016-09-21 LAB — PATHOLOGIST SMEAR REVIEW

## 2016-09-21 MED ORDER — BUDESONIDE-FORMOTEROL FUMARATE 160-4.5 MCG/ACT IN AERO
2.0000 | INHALATION_SPRAY | Freq: Two times a day (BID) | RESPIRATORY_TRACT | Status: DC
  Administered 2016-09-21 – 2016-09-23 (×4): 2 via RESPIRATORY_TRACT

## 2016-09-21 NOTE — Discharge Instructions (Signed)
Video-Assisted Thoracic Surgery, Care After ° °This sheet gives you information about how to care for yourself after your procedure. Your health care provider may also give you more specific instructions. If you have problems or questions, contact your health care provider. °What can I expect after the procedure? °After the procedure, it is common to have: °· Some pain and soreness in your chest. °· Pain when breathing in (inhaling) and coughing. °· Constipation. °· Fatigue. °· Difficulty sleeping. ° °Follow these instructions at home: °Preventing pneumonia °· Take deep breaths or do breathing exercises as instructed by your health care provider. Doing this helps prevent lung infection (pneumonia). °· Cough frequently. Coughing may cause discomfort, but it is important to clear mucus (phlegm) and expand your lungs. If it hurts to cough, hold a pillow against your chest or place the palms of both hands on top of the incision (use splinting) when you cough. This may help relieve discomfort. °· If you were given an incentive spirometer, use it as directed. An incentive spirometer is a tool that measures how well you are filling your lungs with each breath. °· Participate in pulmonary rehabilitation as directed by your health care provider. This is a program that combines education, exercise, and support from a team of specialists. The goal is to help you heal and get back to your normal activities as soon as possible. °Medicines °· Take over-the-counter or prescription medicines only as told by your health care provider. °· If you have pain, take pain-relieving medicine before your pain becomes severe. This is important because if your pain is under control, you will be able to breathe and cough more comfortably. °· If you were prescribed an antibiotic medicine, take it as told by your health care provider. Do not stop taking the antibiotic even if you start to feel better. °Activity °· Ask your health care provider  what activities are safe for you. °· Avoid activities that use your chest muscles for at least 3-4 weeks. °· Do not lift anything that is heavier than 10 lb (4.5 kg), or the limit that your health care provider tells you, until he or she says that it is safe. °Incision care °· Follow instructions from your health care provider about how to take care of your incision(s). Make sure you: °? Wash your hands with soap and water before you change your bandage (dressing). If soap and water are not available, use hand sanitizer. °? Change your dressing as told by your health care provider. °? Leave stitches (sutures), skin glue, or adhesive strips in place. These skin closures may need to stay in place for 2 weeks or longer. If adhesive strip edges start to loosen and curl up, you may trim the loose edges. Do not remove adhesive strips completely unless your health care provider tells you to do that. °· Keep your dressing dry until it has been removed. °· Check your incision area every day for signs of infection. Check for: °? Redness, swelling, or pain. °? Fluid or blood. °? Warmth. °? Pus or a bad smell. °Bathing °· Do not take baths, swim, or use a hot tub until your health care provider approves. You may take showers. °· After your dressing has been removed, use soap and water to gently wash your incision area. Do not use anything else to clean your incision(s) unless your health care provider tells you to do this. °Driving °· Do not drive until your health care provider approves. °· Do not drive   or use heavy machinery while taking prescription pain medicine. °Eating and drinking °· Eat a healthy, balanced diet as instructed by your health care provider. A healthy diet includes plenty of fresh fruits and vegetables, whole grains, and low-fat (lean) proteins. °· Limit foods that are high in fat and processed sugars, such as fried and sweet foods. °· Drink enough fluid to keep your urine clear or pale yellow. °General  instructions °· To prevent or treat constipation while you are taking prescription pain medicine, your health care provider may recommend that you: °? Take over-the-counter or prescription medicines. °? Eat foods that are high in fiber, such as beans, fresh fruits and vegetables, and whole grains. °· Do not use any products that contain nicotine or tobacco, such as cigarettes and e-cigarettes. If you need help quitting, ask your health care provider. °· Avoid secondhand smoke. °· Wear compression stockings as told by your health care provider. These stockings help to prevent blood clots and reduce swelling in your legs. °· If you have a chest tube, care for it as instructed by your health care provider. Do not travel by airplane during the 2 weeks after your chest tube is removed, or until your health care provider says that this is safe. °· Keep all follow-up visits as told by your health care provider. This is important. °Contact a health care provider if: °· You have redness, swelling, or pain around an incision. °· You have fluid or blood coming from an incision. °· Your incision area feels warm to the touch. °· You have pus or a bad smell coming from an incision. °· You have a fever or chills. °· You have nausea or vomiting. °· You have pain that does not get better with medicine. °Get help right away if: °· You have chest pain. °· Your heart is fluttering or beating rapidly. °· You develop a rash. °· You have shortness of breath or trouble breathing. °· You are confused. °· You have trouble speaking. °· You feel weak, light-headed, or dizzy. °· You faint. °Summary °· To help prevent lung infection (pneumonia), take deep breaths or do breathing exercises as instructed by your health care provider. °· Cough frequently to clear mucus (phlegm) and expand your lungs. If it hurts to cough, hold a pillow against your chest or place the palms of both hands on top of the incision (use splinting) when you cough. °· If  you have pain, take pain-relieving medicine before your pain becomes severe. This is important because if your pain is under control, you will be able to breathe and cough more comfortably. °· Ask your health care provider what activities are safe for you. °This information is not intended to replace advice given to you by your health care provider. Make sure you discuss any questions you have with your health care provider. °Document Released: 08/04/2012 Document Revised: 03/19/2016 Document Reviewed: 03/19/2016 °Elsevier Interactive Patient Education © 2017 [employer] ° °

## 2016-09-21 NOTE — Op Note (Signed)
NAME:  [name], [name]                     ACCOUNT NO.:  MEDICAL RECORD NO.:  [id number]  LOCATION:                                 FACILITY:  PHYSICIAN:  Lanelle Bal, MD         DATE OF BIRTH:  DATE OF PROCEDURE:  09/19/2016 DATE OF DISCHARGE:                              OPERATIVE REPORT   PREOPERATIVE DIAGNOSIS:  Left empyema.  POSTOPERATIVE DIAGNOSIS:  Left empyema.  SURGICAL PROCEDURES:  Bronchoscopy, left video-assisted thoracoscopy, decortication.  SURGEON:  Lanelle Bal, MD  FIRST ASSISTANT:  Nicholes Rough, PA  BRIEF HISTORY:  The patient is a 71 year old male who, in early May, developed increasing cough and respiratory symptoms.  He was treated for community-acquired pneumonia and evaluated by the Pulmonary Service. Because of persistent left pleural effusion, he was sent to the Thoracic Surgery office.  A CT scan of the chest was performed and demonstrated a complex fluid collection, consistent with empyema at the left base of the lung.  Because of the patient's persistence with the radiographic findings and symptomatology, a left video-assisted thoracoscopy with drainage and biopsy was recommended.  The patient agreed and signed informed consent.  DESCRIPTION OF PROCEDURE:  With central line and arterial line in place, the patient underwent general endotracheal anesthesia without incidence. Single-lumen endotracheal tube was placed.  Appropriate time-out was performed and we proceeded with fiberoptic bronchoscopy both to the right and left tracheobronchial tree to the subsegmental level.  The right tracheobronchial tree was clear of any endobronchial lesions. Looking in the left, there were significant mucus secretions that were present, but these were easily suctioned and removed.  Washings and cultures of this were obtained.  There were no endobronchial lesions to the left.  The scope was removed and a double-lumen endotracheal tube was placed.  The  patient was turned in lateral decubitus position with the preoperatively marked left side up.  The left chest was prepped with Betadine and draped in usual sterile manner.  A small port incision was made approximately sixth intercostal space posterior axillary line. Through this, we entered the chest and a 30-degree scope was used. Significant complex effusion with proteinaceous debris was present. Port site __________ slightly and through this and with the aid of the video scope, we were able to decorticate the left lower portion of the lung and chest wall, removing as proteinaceous debris as possible. Cultures and pathology were obtained.  Two Blake drains were left in place.  The lung nicely reinflated.  The incision was closed with interrupted 0 Vicryl, running 3-0 Vicryl in subcutaneous __________ subcuticular stitch in skin edges.  Dry dressings were applied.  Sponge and needle count was reported as correct at the completion of the procedure.  The patient tolerated the procedure without obvious complication.  Blood loss was less than 100 mL.  There was approximately 300 to 400 mL of pleural fluid that was also removed.  The patient was extubated in the operating room and transferred to the recovery room, having tolerated the procedure without complication.     Lanelle Bal, MD     EG/MEDQ  D:  09/21/2016  T:  09/21/2016  Job:  [id number]  cc:   Dr. Leonides Schanz

## 2016-09-21 NOTE — Progress Notes (Signed)
CARDIAC REHAB PHASE I   Pt in bed, resting, states he already walked twice today, declines ambulation at this time. Will follow-up tomorrow as schedule permits.   Lenna Sciara, RN, BSN 09/21/2016 3:09 PM

## 2016-09-21 NOTE — Progress Notes (Addendum)
      [address]       [place] [address]             [phone]      2 Days Post-Op Procedure(s) (LRB): VIDEO BRONCHOSCOPY (N/A) LEFT VIDEO ASSISTED THORACOSCOPY (VATS)/DECORTICATION (Left) Subjective: Feels okay. No issues overnight.   Objective: Vital signs in last 24 hours: Temp:  [97.5 F (36.4 C)-98.2 F (36.8 C)] 98.2 F (36.8 C) (06/01 0411) Pulse Rate:  [66-87] 83 (06/01 0411) Cardiac Rhythm: Normal sinus rhythm (05/31 2015) Resp:  [12-20] 18 (06/01 0411) BP: (112-166)/(51-103) 135/51 (06/01 0411) SpO2:  [90 %-99 %] 90 % (06/01 0411) Weight:  [81.7 kg (180 lb 1.6 oz)] 81.7 kg (180 lb 1.6 oz) (06/01 0411)     Intake/Output from previous day: 05/31 0701 - 06/01 0700 In: 720 [P.O.:720] Out: 1240 [Urine:1200; Chest Tube:40] Intake/Output this shift: No intake/output data recorded.  General appearance: alert, cooperative and no distress Heart: regular rate and rhythm, S1, S2 normal, no murmur, click, rub or gallop Lungs: clear to auscultation bilaterally Abdomen: soft, non-tender; bowel sounds normal; no masses,  no organomegaly Extremities: extremities normal, atraumatic, no cyanosis or edema Wound: clean and dry  Lab Results:  Recent Labs  09/20/16 0408 09/21/16 0538  WBC 9.2 9.6  HGB 10.7* 11.2*  HCT 33.6* 35.0*  PLT 357 338   BMET:  Recent Labs  09/20/16 0408 09/21/16 0538  NA 140 136  K 3.6 3.7  CL 105 101  CO2 28 27  GLUCOSE 115* 104*  BUN 9 14  CREATININE 0.85 0.89  CALCIUM 8.6* 8.7*    PT/INR:  Recent Labs  09/19/16 0656  LABPROT 14.9  INR 1.17   ABG    Component Value Date/Time   PHART 7.443 09/20/2016 0420   HCO3 27.1 09/20/2016 0420   O2SAT 97.3 09/20/2016 0420   CBG (last 3)   Recent Labs  09/19/16 2352 09/20/16 0346 09/20/16 0840  GLUCAP 80 107* 106*    Assessment/Plan: S/P Procedure(s) (LRB): VIDEO BRONCHOSCOPY (N/A) LEFT VIDEO ASSISTED THORACOSCOPY (VATS)/DECORTICATION (Left)  1.  CV-NSR in the 80s. BP well controlled.  2. Pulm-Tolerating room air. CXR this morning showed no pneumothorax. Left lower lobe atelectasis and small left pleural effusion. Mild right base atelectasis versus infiltrate. 88ml out of chest tube this morning. Currently on water seal. No air leak 3. Renal- creatinine 0.89, electrolytes okay.  4. H and H trending up 5. Endo-blood glucose level well controlled.  6. Follow pathology  Plan: Likely remove anterior tube today. No air leak. . Continue ambulation. Continue incentive spirometry.     LOS: 2 days    [name] 09/21/2016  One chest tube out today, remaining chest  tube out in am   Poss d/c Sunday Cultures negative so far Final path still pending  I have seen and examined Brent Gonzales and agree with the above assessment  and plan.  Grace Isaac MD Bee[facility] [phone] Office [phone] 09/21/2016 6:43 PM

## 2016-09-21 NOTE — Discharge Summary (Signed)
Physician Discharge Summary  Patient ID: Brent Gonzales MRN: [id number] DOB/AGE: 28-Jul-1945 71 y.o.  Admit date: 09/19/2016 Discharge date: 09/23/2016  Admission Diagnoses: Patient Active Problem List   Diagnosis Date Noted  . Parapneumonic effusion on L  09/08/2016  . Acute respiratory failure with hypoxia ([place]) 08/28/2016  . COPD (chronic obstructive pulmonary disease) ([place]) 08/28/2016    Discharge Diagnoses:  Active Problems:   S/P thoracotomy   Discharged Condition: good  HPI:  Brent Gonzales 71 y.o. male is seen in the office  Today. Patient is followed in the pulmonary office for long-term smoking and COPD, quit smoking  February 2018.  He noted onset of fever chills and cough and left pleuritic chest pain. He was diagnosed with community-acquired pneumonia, a CT scan of the abdomen was performed that showed left lower lobe consolidation and a small effusion. He notes that a course of a week of by mouth antibiotics his symptoms have improved. Over the second last several days his pleuritic pain is better. Attempted thoracentesis only removed 100 mL of straw-colored fluid on 5/ 18. The patient is referred from the pulmonary office for consideration of left VATS. The patient denies any occupational asbestos exposure that he is aware of, worked primarily as a Ecologist.   Hospital Course:  On 09/19/2016 Brent Gonzales underwent a left video-assisted thoracoscopy and decortication with a video bronchoscopy with Dr. Servando Snare. He tolerated the procedure well and was transferred to the ICU. He was extubated in a timely manner. On postop day 1 he continued to progress. We changed his chest tube to waterseal. He had some expected blood loss anemia which we continue to monitor. We initiated diuretic regimen for fluid overload. We began to mobilize the patient. Pathology was pending at this time. He was transferred to the telemetry unit. Postop day 2 he remained in normal sinus rhythm  in the 80s. We were able to remove his anterior chest tube. His output continued to trend down. His hemoglobin and hematocrit continued to trend up. We removed the posterior chest tube without issue. He was tolerating room air. He was ambulating with limited assistance.He is ready for discharge  Consults: None  Significant Diagnostic Studies:  CLINICAL DATA:  Chest tube.  EXAM: PORTABLE CHEST 1 VIEW  COMPARISON:  09/20/2016.  FINDINGS: Interim removal right IJ line. Left chest tubes in stable position. No pneumothorax. Left lower lobe atelectasis/ infiltrate. Small left pleural effusion. Mild right base atelectasis versus infiltrate. No pneumothorax .  IMPRESSION: 1. Interim removal right IJ line. Two left chest tubes in stable position. No pneumothorax.  2. Left lower lobe atelectasis and infiltrate small left pleural effusion. Mild right base atelectasis versus infiltrate.   Electronically Signed   By: Marcello Moores  Register   On: 09/21/2016 06:33  Treatments:   NAME:  Brent Gonzales                     ACCOUNT NO.:  MEDICAL RECORD NO.:  [id number]  LOCATION:                                 FACILITY:  PHYSICIAN:  Lanelle Bal, MD         DATE OF BIRTH:  DATE OF PROCEDURE:  09/19/2016 DATE OF DISCHARGE:  OPERATIVE REPORT   PREOPERATIVE DIAGNOSIS:  Left empyema.  POSTOPERATIVE DIAGNOSIS:  Left empyema.  SURGICAL PROCEDURES:  Bronchoscopy, left video-assisted thoracoscopy, decortication.  SURGEON:  Lanelle Bal, MD  FIRST ASSISTANT:  Nicholes Rough, PA  Discharge Exam: Blood pressure (!) 123/51, pulse 83, temperature 97.7 F (36.5 C), temperature source Oral, resp. rate 18, height 6\' 2"  (1.88 m), weight 79.8 kg (175 lb 14.4 oz), SpO2 93 %.   General appearance: alert, cooperative and no distress Heart: regular rate and rhythm, S1, S2 normal, no murmur, click, rub or gallop Lungs: clear to auscultation  bilaterally Abdomen: soft, non-tender; bowel sounds normal; no masses,  no organomegaly Extremities: small amount of lower extremity swelling L > R Wound: thoracotomy incision with hematoma, chest tube sites c/d/i  Disposition: 01-Home or Self Care  Discharge Instructions    Discharge patient    Complete by:  As directed    Discharge disposition:  01-Home or Self Care   Discharge patient date:  09/23/2016     Allergies as of 09/23/2016      Reactions   Penicillins Other (See Comments)   Family history if this allergy Has patient had a PCN reaction causing immediate rash, facial/tongue/throat swelling, SOB or lightheadedness with hypotension: No Has patient had a PCN reaction causing severe rash involving mucus membranes or skin necrosis: No Has patient had a PCN reaction that required hospitalization: No Has patient had a PCN reaction occurring within the last 10 years: No If all of the above answers are "NO", then may proceed with Cephalosporin use.      Medication List    STOP taking these medications   Co Q-10 200 MG Caps     TAKE these medications   acetaminophen 500 MG tablet Commonly known as:  TYLENOL Take 1,000 mg by mouth 2 (two) times daily as needed for mild pain or headache.   albuterol 108 (90 Base) MCG/ACT inhaler Commonly known as:  PROVENTIL HFA;VENTOLIN HFA Inhale 2 puffs into the lungs every 6 (six) hours as needed for wheezing or shortness of breath.   aspirin 81 MG tablet Take 81 mg by mouth daily.   atorvastatin 40 MG tablet Commonly known as:  LIPITOR TAKE 1 TABLET (40 MG TOTAL) BY MOUTH DAILY.   budesonide-formoterol 160-4.5 MCG/ACT inhaler Commonly known as:  SYMBICORT Inhale 2 puffs into the lungs 2 (two) times daily.   cetirizine 10 MG tablet Commonly known as:  ZYRTEC Take 10 mg by mouth daily as needed for allergies.   clopidogrel 75 MG tablet Commonly known as:  PLAVIX Take 1 tablet (75 mg total) by mouth daily with breakfast.    fluticasone 50 MCG/ACT nasal spray Commonly known as:  FLONASE Place 2 sprays into both nostrils daily as needed for allergies or rhinitis.   glucosamine-chondroitin 500-400 MG tablet Take 1 tablet by mouth 2 (two) times daily.   levofloxacin 750 MG tablet Commonly known as:  LEVAQUIN Take 1 tablet (750 mg total) by mouth daily.   multivitamin with minerals Tabs tablet Take 1 tablet by mouth daily.   pantoprazole 40 MG tablet Commonly known as:  PROTONIX TAKE 1 TABLET (40 MG TOTAL) BY MOUTH DAILY. TAKE 30-60 MIN BEFORE FIRST MEAL OF THE DAY   traMADol 50 MG tablet Commonly known as:  ULTRAM Take 1 tablet (50 mg total) by mouth every 6 (six) hours as needed (mild pain).   Vitamin D 2000 units Caps Take 2,000 Units by mouth daily.      Follow-up  Information    [name], MD. Call in 1 day(s).   Specialty:  Family Medicine Contact information: [address] [place] [address] [phone]        Grace Isaac, MD Follow up.   Specialty:  Cardiothoracic Surgery Why:  Your appointment is on 10/09/2016 at 3:30pm. Please arrive at 3:00pm for a chest xray at [facility] which is located on the first floor of our building.  Contact information: [place] [address] [phone]        Nursing Follow up.   Why:  Your suture removal appointment is on 09/28/2016 at 10:30am.  Contact information: [facility] office          Signed: [name] 09/23/2016, 2:05 PM

## 2016-09-21 NOTE — Progress Notes (Signed)
Patient education completed for removal of chest tube,he verbalized understanding anterior chest tube discontinued as ordered, will continue to monitor.

## 2016-09-22 ENCOUNTER — Inpatient Hospital Stay (HOSPITAL_COMMUNITY): Payer: Medicare Other

## 2016-09-22 LAB — BODY FLUID CULTURE
Culture: NO GROWTH
Culture: NO GROWTH

## 2016-09-22 LAB — CULTURE, RESPIRATORY W GRAM STAIN: Gram Stain: NONE SEEN

## 2016-09-22 MED ORDER — POLYETHYLENE GLYCOL 3350 17 G PO PACK
17.0000 g | PACK | Freq: Every day | ORAL | Status: DC
  Administered 2016-09-22: 17 g via ORAL
  Filled 2016-09-22 (×2): qty 1

## 2016-09-22 NOTE — Progress Notes (Addendum)
      [address]       [place] [address]             [phone]      3 Days Post-Op Procedure(s) (LRB): VIDEO BRONCHOSCOPY (N/A) LEFT VIDEO ASSISTED THORACOSCOPY (VATS)/DECORTICATION (Left) Subjective: No issues overnight. Resting peacefully  Objective: Vital signs in last 24 hours: Temp:  [97.2 F (36.2 C)-98.3 F (36.8 C)] 98.1 F (36.7 C) (06/02 0408) Pulse Rate:  [80-88] 80 (06/02 0408) Cardiac Rhythm: Normal sinus rhythm (06/02 0700) Resp:  [18-19] 18 (06/02 0408) BP: (116-148)/(48-63) 125/51 (06/02 0408) SpO2:  [91 %-94 %] 91 % (06/02 0408) Weight:  [81.2 kg (179 lb 1.6 oz)] 81.2 kg (179 lb 1.6 oz) (06/02 0408)    Intake/Output from previous day: 06/01 0701 - 06/02 0700 In: 480 [P.O.:480] Out: 30 [Chest Tube:30] Intake/Output this shift: No intake/output data recorded.  General appearance: alert, cooperative and no distress Heart: regular rate and rhythm, S1, S2 normal, no murmur, click, rub or gallop Lungs: clear to auscultation bilaterally Abdomen: soft, non-tender; bowel sounds normal; no masses,  no organomegaly Extremities: small amount of lower extremity edema L > R Wound: clean and dry  Lab Results:  Recent Labs  09/20/16 0408 09/21/16 0538  WBC 9.2 9.6  HGB 10.7* 11.2*  HCT 33.6* 35.0*  PLT 357 338   BMET:  Recent Labs  09/20/16 0408 09/21/16 0538  NA 140 136  K 3.6 3.7  CL 105 101  CO2 28 27  GLUCOSE 115* 104*  BUN 9 14  CREATININE 0.85 0.89  CALCIUM 8.6* 8.7*    PT/INR: No results for input(s): LABPROT, INR in the last 72 hours. ABG    Component Value Date/Time   PHART 7.443 09/20/2016 0420   HCO3 27.1 09/20/2016 0420   O2SAT 97.3 09/20/2016 0420   CBG (last 3)   Recent Labs  09/19/16 2352 09/20/16 0346 09/20/16 0840  GLUCAP 80 107* 106*    Assessment/Plan: S/P Procedure(s) (LRB): VIDEO BRONCHOSCOPY (N/A) LEFT VIDEO ASSISTED THORACOSCOPY (VATS)/DECORTICATION (Left)  1. CV-NSR in the 80s. BP  well controlled.  2. Pulm-Tolerating room air. CXR pending this morning. One chest tube removed yesterday 3. Renal- creatinine 0.89, electrolytes okay. LL edema. Perhaps could benefit from a dose of lasix. Instructed to elevate legs while in the chair.  4. H and H trending up 5. Endo-blood glucose level well controlled.  6. Follow pathology, cultures NTD  Plan: Await chest xray, then likely remove remaining chest tube. Home tomorrow if remains stable.    LOS: 3 days    [name] 09/22/2016  I have seen and examined the patient and agree with the assessment and plan as outlined.  CXR looks good.  D/C tube.  Anticipate D/C home tomorrow  Rexene Alberts, MD 09/22/2016 1:51 PM

## 2016-09-22 NOTE — Progress Notes (Signed)
1253 Pt eating lunch. Stated he will walk on his own. Chest tube is out now. Will sign off since pt can walk independently. Graylon Good RN BSN 09/22/2016 12:54 PM

## 2016-09-22 NOTE — Progress Notes (Signed)
Patient education completed fro removal of chest tube, patient verbalised understanding. Chest tube removed as ordered

## 2016-09-23 ENCOUNTER — Inpatient Hospital Stay (HOSPITAL_COMMUNITY): Payer: Medicare Other

## 2016-09-23 ENCOUNTER — Emergency Department (HOSPITAL_COMMUNITY)
Admission: EM | Admit: 2016-09-23 | Discharge: 2016-09-23 | Disposition: A | Discharge status: Home / Self Care | Payer: Medicare Other | Attending: Emergency Medicine | Admitting: Emergency Medicine

## 2016-09-23 ENCOUNTER — Encounter (HOSPITAL_COMMUNITY): Payer: Self-pay | Admitting: Emergency Medicine

## 2016-09-23 DIAGNOSIS — R040 Epistaxis: Secondary | ICD-10-CM | POA: Diagnosis not present

## 2016-09-23 DIAGNOSIS — Z5321 Procedure and treatment not carried out due to patient leaving prior to being seen by health care provider: Secondary | ICD-10-CM | POA: Diagnosis not present

## 2016-09-23 HISTORY — DX: Pyothorax without fistula: J86.9

## 2016-09-23 LAB — BPAM RBC
BLOOD PRODUCT EXPIRATION DATE: 201806132359
Blood Product Expiration Date: 201806132359
Blood Product Expiration Date: 201806132359
Blood Product Expiration Date: 201806132359
ISSUE DATE / TIME: 201805300757
ISSUE DATE / TIME: 201806011628
UNIT TYPE AND RH: 6200
UNIT TYPE AND RH: 6200
Unit Type and Rh: 6200
Unit Type and Rh: 6200

## 2016-09-23 LAB — TYPE AND SCREEN
ABO/RH(D): A POS
Antibody Screen: NEGATIVE
UNIT DIVISION: 0
UNIT DIVISION: 0
Unit division: 0
Unit division: 0

## 2016-09-23 MED ORDER — LEVOFLOXACIN 750 MG PO TABS: 750.0000 mg | ORAL_TABLET | Freq: Every day | ORAL | 0 refills | Status: DC

## 2016-09-23 MED ORDER — TRAMADOL HCL 50 MG PO TABS: 50.0000 mg | ORAL_TABLET | Freq: Four times a day (QID) | ORAL | 0 refills | Status: DC | PRN

## 2016-09-23 MED ORDER — LEVOFLOXACIN 750 MG PO TABS: 750.0000 mg | ORAL_TABLET | Freq: Every day | ORAL | 0 refills | Status: AC

## 2016-09-23 MED ORDER — LEVOFLOXACIN 750 MG PO TABS: 750.0000 mg | ORAL_TABLET | Freq: Every day | ORAL | Status: DC

## 2016-09-23 NOTE — ED Triage Notes (Signed)
Pt presents with nosebleed x 12 hours. Small amount bright red blood on rag in triage. Pt d/c this am from this facility after PNA with chest tube insertions. Pt concerned he received Plavix inpatient.

## 2016-09-23 NOTE — ED Notes (Signed)
Patient at the desk at this time.  States I checked in for a nosebleed but it has stopped so I am going to see my doctor tomorrow.  Bleeding noted to be stopped at this time.  Encouraged to return if needed.

## 2016-09-23 NOTE — Progress Notes (Signed)
Patient in a stable condition, discharge education reviewed with patient he verbalized understanding, paper [rescription given to patient , patient belongings at bedside, iv dc , tele dc ccmd notified, patients wife to transport patient home.

## 2016-09-23 NOTE — Progress Notes (Addendum)
      [address]       [place] [address]             [phone]      4 Days Post-Op Procedure(s) (LRB): VIDEO BRONCHOSCOPY (N/A) LEFT VIDEO ASSISTED THORACOSCOPY (VATS)/DECORTICATION (Left) Subjective: No issues overnight. Feels good this morning eating breakfast  Objective: Vital signs in last 24 hours: Temp:  [98.2 F (36.8 C)-98.4 F (36.9 C)] 98.4 F (36.9 C) (06/03 0509) Pulse Rate:  [78-91] 80 (06/02 2015) Cardiac Rhythm: Normal sinus rhythm (06/02 1900) Resp:  [18-20] 18 (06/03 0509) BP: (119-148)/(44-57) 148/47 (06/03 0509) SpO2:  [90 %-99 %] 90 % (06/03 0509) Weight:  [79.8 kg (175 lb 14.4 oz)] 79.8 kg (175 lb 14.4 oz) (06/03 0509)     Intake/Output from previous day: 06/02 0701 - 06/03 0700 In: 960 [P.O.:960] Out: -  Intake/Output this shift: No intake/output data recorded.  General appearance: alert, cooperative and no distress Heart: regular rate and rhythm, S1, S2 normal, no murmur, click, rub or gallop Lungs: clear to auscultation bilaterally Abdomen: soft, non-tender; bowel sounds normal; no masses,  no organomegaly Extremities: small amount of lower extremity swelling L > R Wound: thoracotomy incision with hematoma, chest tube sites c/d/i  Lab Results:  Recent Labs  09/21/16 0538  WBC 9.6  HGB 11.2*  HCT 35.0*  PLT 338   BMET:  Recent Labs  09/21/16 0538  NA 136  K 3.7  CL 101  CO2 27  GLUCOSE 104*  BUN 14  CREATININE 0.89  CALCIUM 8.7*    PT/INR: No results for input(s): LABPROT, INR in the last 72 hours. ABG    Component Value Date/Time   PHART 7.443 09/20/2016 0420   HCO3 27.1 09/20/2016 0420   O2SAT 97.3 09/20/2016 0420   CBG (last 3)   Recent Labs  09/20/16 0840  GLUCAP 106*    Assessment/Plan: S/P Procedure(s) (LRB): VIDEO BRONCHOSCOPY (N/A) LEFT VIDEO ASSISTED THORACOSCOPY (VATS)/DECORTICATION (Left)  1. CV-NSR in the 80s. BP well controlled.  2. Pulm-Tolerating room air. CXR stable  without pneumothorax 3. Renal- last creatinine 0.89, electrolytes okay. LL edema. Perhaps could benefit from a dose of lasix. Instructed to elevate legs while in the chair.  4. H and H trending up 5. Endo-blood glucose level well controlled.  6. Follow pathology, cultures NTD 7. Wound-hematoma under thoracotomy incision, likely due to being on Plavix. Will ask attending to look at before discharge.   Plan: CXR this morning stable, likely home today.    LOS: 4 days    Brent Gonzales 09/23/2016  I have seen and examined the patient and agree with the assessment and plan as outlined.  [name] does have a significant hematoma at his surgical wound, but it appears stable and there are no indications for drainage/evacuation at this time.  D/C home today.  BAL culture from OR grew Strep pneumonia - will Rx with Levaquin due to PCN allergy.  Brent Alberts, MD 09/23/2016 9:55 AM

## 2016-09-24 ENCOUNTER — Telehealth: Payer: Self-pay | Admitting: Internal Medicine

## 2016-09-24 LAB — ANAEROBIC CULTURE

## 2016-09-24 NOTE — Telephone Encounter (Signed)
Spoke with patient with MR's recs. Patient verbalized understanding. Nothing further needed at time of call.

## 2016-09-24 NOTE — Telephone Encounter (Signed)
They are the same/ interchangeble

## 2016-09-24 NOTE — Telephone Encounter (Signed)
Spoke with the pt  He states that the hospital doc gave him a Dulera 200 mcg sample  He still has the Symb 160, but wants to know if okay to use the [facility] sample until it runs out so he doesn't waste it  Please advise if this is appropriate, thanks!

## 2016-09-25 LAB — ANAEROBIC CULTURE

## 2016-09-28 ENCOUNTER — Encounter: Payer: Self-pay | Admitting: Family Medicine

## 2016-09-28 ENCOUNTER — Encounter (INDEPENDENT_AMBULATORY_CARE_PROVIDER_SITE_OTHER): Payer: Self-pay

## 2016-09-28 ENCOUNTER — Ambulatory Visit (INDEPENDENT_AMBULATORY_CARE_PROVIDER_SITE_OTHER): Payer: Medicare Other | Admitting: Family Medicine

## 2016-09-28 VITALS — BP 118/58 | HR 82 | Temp 98.6°F | Resp 18 | Ht 74.0 in | Wt 176.0 lb

## 2016-09-28 DIAGNOSIS — Z09 Encounter for follow-up examination after completed treatment for conditions other than malignant neoplasm: Secondary | ICD-10-CM | POA: Diagnosis not present

## 2016-09-28 DIAGNOSIS — Z4802 Encounter for removal of sutures: Secondary | ICD-10-CM

## 2016-09-28 NOTE — Progress Notes (Signed)
Subjective:    Patient ID: Brent Gonzales, male    DOB: [birth year], 71 y.o.   MRN: [id number]  HPI  09/03/16 Recently admitted to the hospital with one-week history of fever, muscle aches, coughing, and shortness of breath. CAT scan emergency room revealed left lower lobe pneumonia and left-sided effusion. He also had reactive lymphadenopathy in the abdomen and pelvis which was minor. Patient also had mild elevation in his liver function tests which were attributed to his illness. He was treated with a one-week course of Levaquin. He is admitted to hospital on May 8 and was discharged on May 9. He is here today for follow-up. Culture results have returned negative thus far. He feels much better. His chest pain is improving. His cough is improving. He has been afebrile for the last 5 days. He still have some mild residual left-sided discomfort.  AT that time, my plan was: Clinically, the patient is recovering well. I anticipate that he will be back to 100% by next week. I would like to repeat a chest x-ray in 4 weeks to ensure resolution. Meanwhile I'll follow-up on his transaminitis by repeating a CBC and a CMP today. Continue to encourage smoking cessation but at the present time, the patient has no desire for Chantix. Once liver function tests have improved, I would like the patient to resume his statin  09/28/16 The following week after I saw the patient, he developed increasing shortness of breath. Was seen by his pulmonologist. He was found to have an increasing left-sided pleural effusion. He was referred to cardiothoracic surgery who recommended VATS decortication.  Recently was admitted and underwent the procedure. Was discharged home and is here today for follow-up. He has a large hematoma near the surgical site. The surgical incision is oozing blood from the inferior portion. His chest tube site appears to be healing well without evidence of cellulitis.  Day 1 of discharge home, the patient  experienced severe epistaxis and he went to the emergency room area however epistaxis resolved spontaneously before he was seen in he left. He has not resumed his Plavix since discharge home. The first 3 days home from the hospital, he felt extremely tired and weak and short of breath. He was discharged home on Levaquin and did appear to have on a postoperative chest x-ray right lower lobe atelectasis versus infiltrate. However he is improving gradually every day and feels better today. He states he feels the best today he has felt since prior to his surgery. Past Medical History:  Diagnosis Date  . Aorto-iliac disease ([place])    mild bilateral external. ABIs in the 0.9 range  . Carotid artery disease ([place])    right ICA stenosis  . COPD (chronic obstructive pulmonary disease) ([place])   . Coronary artery disease   . Empyema ([place])   . High cholesterol   . Hyperlipidemia   . Hypertension    no longer- on medications  . Pneumonia 08/2016  . Tobacco abuse    Past Surgical History:  Procedure Laterality Date  . COLONOSCOPY W/ POLYPECTOMY    . DOPPLER ECHOCARDIOGRAPHY    . Duplex ultrasound    . HERNIA REPAIR     left and right  . NM MYOVIEW LTD  03/2010   Inferior septal ischemia  . stress myocardial perfusion    . TONSILLECTOMY AND ADENOIDECTOMY    . VIDEO ASSISTED THORACOSCOPY (VATS)/DECORTICATION Left 09/19/2016   Procedure: LEFT VIDEO ASSISTED THORACOSCOPY (VATS)/DECORTICATION;  Surgeon: Grace Isaac, MD;  Location: [facility] OR;  Service: Thoracic;  Laterality: Left;  Marland Kitchen VIDEO BRONCHOSCOPY N/A 09/19/2016   Procedure: VIDEO BRONCHOSCOPY;  Surgeon: Grace Isaac, MD;  Location[facility] OR;  Service: Thoracic;  Laterality: N/A;   Current Outpatient Prescriptions on File Prior to Visit  Medication Sig Dispense Refill  . acetaminophen (TYLENOL) 500 MG tablet Take 1,000 mg by mouth 2 (two) times daily as needed for mild pain or headache.     . albuterol (PROVENTIL HFA;VENTOLIN HFA) 108 (90 Base)  MCG/ACT inhaler Inhale 2 puffs into the lungs every 6 (six) hours as needed for wheezing or shortness of breath. 3 Inhaler 3  . aspirin 81 MG tablet Take 81 mg by mouth daily.    Marland Kitchen atorvastatin (LIPITOR) 40 MG tablet TAKE 1 TABLET (40 MG TOTAL) BY MOUTH DAILY. 360 tablet 0  . budesonide-formoterol (SYMBICORT) 160-4.5 MCG/ACT inhaler Inhale 2 puffs into the lungs 2 (two) times daily. 1 Inhaler 0  . cetirizine (ZYRTEC) 10 MG tablet Take 10 mg by mouth daily as needed for allergies.    . Cholecalciferol (VITAMIN D) 2000 UNITS CAPS Take 2,000 Units by mouth daily.    . fluticasone (FLONASE) 50 MCG/ACT nasal spray Place 2 sprays into both nostrils daily as needed for allergies or rhinitis.    Marland Kitchen glucosamine-chondroitin 500-400 MG tablet Take 1 tablet by mouth 2 (two) times daily.    Marland Kitchen levofloxacin (LEVAQUIN) 750 MG tablet Take 1 tablet (750 mg total) by mouth daily. 10 tablet 0  . Multiple Vitamin (MULTIVITAMIN WITH MINERALS) TABS tablet Take 1 tablet by mouth daily.    . pantoprazole (PROTONIX) 40 MG tablet TAKE 1 TABLET (40 MG TOTAL) BY MOUTH DAILY. TAKE 30-60 MIN BEFORE FIRST MEAL OF THE DAY 30 tablet 2  . traMADol (ULTRAM) 50 MG tablet Take 1 tablet (50 mg total) by mouth every 6 (six) hours as needed (mild pain). 30 tablet 0  . clopidogrel (PLAVIX) 75 MG tablet Take 1 tablet (75 mg total) by mouth daily with breakfast. (Patient not taking: Reported on 09/28/2016) 365 tablet 0   No current facility-administered medications on file prior to visit.    Allergies  Allergen Reactions  . Penicillins Other (See Comments)    Family history if this allergy Has patient had a PCN reaction causing immediate rash, facial/tongue/throat swelling, SOB or lightheadedness with hypotension: No Has patient had a PCN reaction causing severe rash involving mucus membranes or skin necrosis: No Has patient had a PCN reaction that required hospitalization: No Has patient had a PCN reaction occurring within the last 10  years: No If all of the above answers are "NO", then may proceed with Cephalosporin use.    Social History   Social History  . Marital status: Married    Spouse name: N/A  . Number of children: N/A  . Years of education: N/A   Occupational History  . Not on file.   Social History Main Topics  . Smoking status: Former Smoker    Packs/day: 0.50    Years: 45.00    Quit date: 08/21/2016  . Smokeless tobacco: Never Used  . Alcohol use 4.2 oz/week    7 Cans of beer per week  . Drug use: No  . Sexual activity: Yes   Other Topics Concern  . Not on file   Social History Narrative  . No narrative on file      Review of Systems  All other systems reviewed and are negative.  Objective:   Physical Exam  Constitutional: He appears well-developed and well-nourished. No distress.  Neck: Neck supple.  Cardiovascular: Normal rate, regular rhythm and normal heart sounds.   No murmur heard. Pulmonary/Chest: Effort normal and breath sounds normal. No respiratory distress. He has no wheezes. He has no rales.  Abdominal: Soft. Bowel sounds are normal. He exhibits no distension and no mass. There is no tenderness. There is no rebound and no guarding.  Musculoskeletal: He exhibits no edema.  Lymphadenopathy:    He has no cervical adenopathy.  Skin: He is not diaphoretic.  Vitals reviewed.         Assessment & Plan:  Hospital discharge follow-up - Plan: CBC with Differential/Platelet, COMPLETE METABOLIC PANEL WITH GFR  Lungs grossly clear to auscultation bilaterally today. I appreciate no wheezes crackles Rales. There is no evidence of a postoperative pneumonia. I do not appreciate any crackles in the left lower lobe near his surgical site. Therefore I see no clinical evidence of recurrent pleural effusion. I do want to check a CBC to monitor for postoperative anemia to ensure that the patient's fatigue is not secondary to blood loss. I will also check a CMP to evaluate for any  flow abnormalities or dehydration. I would continue to hold Plavix for the next week until the bleeding from the surgical site improves. He would feel more comfortable doing this as well. I would like the patient to go for a chest x-ray next week to follow-up his right lower lobe atelectasis versus infiltrate. Meanwhile asked the patient to complete his antibiotics. More than 25 minutes was spent with the patient in direct consultation and evaluation

## 2016-09-29 LAB — COMPLETE METABOLIC PANEL WITH GFR
ALT: 19 U/L (ref 9–46)
AST: 18 U/L (ref 10–35)
Albumin: 3.3 g/dL — ABNORMAL LOW (ref 3.6–5.1)
Alkaline Phosphatase: 81 U/L (ref 40–115)
BUN: 12 mg/dL (ref 7–25)
CALCIUM: 8.8 mg/dL (ref 8.6–10.3)
CHLORIDE: 103 mmol/L (ref 98–110)
CO2: 24 mmol/L (ref 20–31)
CREATININE: 1.08 mg/dL (ref 0.70–1.18)
GFR, Est African American: 79 mL/min (ref >=60)
GFR, Est Non African American: 69 mL/min (ref >=60)
Glucose, Bld: 94 mg/dL (ref 70–99)
Potassium: 4.5 mmol/L (ref 3.5–5.3)
Sodium: 139 mmol/L (ref 135–146)
TOTAL PROTEIN: 7 g/dL (ref 6.1–8.1)
Total Bilirubin: 0.5 mg/dL (ref 0.2–1.2)

## 2016-09-29 LAB — CBC WITH DIFFERENTIAL/PLATELET
BASOS PCT: 0 %
Basophils Absolute: 0 cells/uL (ref 0–200)
Eosinophils Absolute: 535 cells/uL — ABNORMAL HIGH (ref 15–500)
Eosinophils Relative: 5 %
HCT: 36.2 % — ABNORMAL LOW (ref 38.5–50.0)
Hemoglobin: 11.8 g/dL — ABNORMAL LOW (ref 13.0–17.0)
LYMPHS ABS: 1712 {cells}/uL (ref 850–3900)
Lymphocytes Relative: 16 %
MCH: 31.1 pg (ref 27.0–33.0)
MCHC: 32.6 g/dL (ref 32.0–36.0)
MCV: 95.5 fL (ref 80.0–100.0)
MPV: 9 fL (ref 7.5–12.5)
Monocytes Absolute: 535 cells/uL (ref 200–950)
Monocytes Relative: 5 %
NEUTROS ABS: 7918 {cells}/uL — AB (ref 1500–7800)
Neutrophils Relative %: 74 %
Platelets: 408 10*3/uL — ABNORMAL HIGH (ref 140–400)
RBC: 3.79 MIL/uL — AB (ref 4.20–5.80)
RDW: 13.3 % (ref 11.0–15.0)
WBC: 10.7 10*3/uL (ref 3.8–10.8)

## 2016-10-04 ENCOUNTER — Ambulatory Visit: Payer: Medicare Other | Admitting: Family Medicine

## 2016-10-08 ENCOUNTER — Other Ambulatory Visit: Payer: Self-pay | Admitting: Cardiothoracic Surgery

## 2016-10-08 DIAGNOSIS — Z9889 Other specified postprocedural states: Secondary | ICD-10-CM

## 2016-10-09 ENCOUNTER — Encounter: Payer: Self-pay | Admitting: Physician Assistant

## 2016-10-09 ENCOUNTER — Ambulatory Visit
Admission: RE | Admit: 2016-10-09 | Discharge: 2016-10-09 | Disposition: A | Discharge status: Home / Self Care | Payer: Medicare Other | Source: Ambulatory Visit | Attending: Cardiothoracic Surgery | Admitting: Cardiothoracic Surgery

## 2016-10-09 ENCOUNTER — Ambulatory Visit (INDEPENDENT_AMBULATORY_CARE_PROVIDER_SITE_OTHER): Payer: Self-pay | Admitting: Cardiothoracic Surgery

## 2016-10-09 VITALS — BP 105/69 | HR 89 | Resp 16 | Ht 74.0 in | Wt 178.6 lb

## 2016-10-09 DIAGNOSIS — Z9889 Other specified postprocedural states: Secondary | ICD-10-CM

## 2016-10-09 DIAGNOSIS — J90 Pleural effusion, not elsewhere classified: Secondary | ICD-10-CM | POA: Diagnosis not present

## 2016-10-09 DIAGNOSIS — Z09 Encounter for follow-up examination after completed treatment for conditions other than malignant neoplasm: Secondary | ICD-10-CM

## 2016-10-09 DIAGNOSIS — J918 Pleural effusion in other conditions classified elsewhere: Secondary | ICD-10-CM

## 2016-10-09 DIAGNOSIS — J189 Pneumonia, unspecified organism: Secondary | ICD-10-CM

## 2016-10-09 NOTE — Progress Notes (Signed)
[address]       [place] [address]             [phone]      Brent Gonzales is a 71 y.o. male patient s/p thoracotomy with VATS on 09/19/2016 who presents for his follow-up appointment.   1. Parapneumonic effusion   2. Surgery follow-up   3. History of thoracentesis    Past Medical History:  Diagnosis Date  . Aorto-iliac disease ([place])    mild bilateral external. ABIs in the 0.9 range  . Carotid artery disease ([place])    right ICA stenosis  . COPD (chronic obstructive pulmonary disease) ([place])   . Coronary artery disease   . Empyema ([place])   . High cholesterol   . Hyperlipidemia   . Hypertension    no longer- on medications  . Pneumonia 08/2016  . Tobacco abuse    No past surgical history pertinent negatives on file. Scheduled Meds: Current Outpatient Prescriptions on File Prior to Visit  Medication Sig Dispense Refill  . acetaminophen (TYLENOL) 500 MG tablet Take 1,000 mg by mouth 2 (two) times daily as needed for mild pain or headache.     . albuterol (PROVENTIL HFA;VENTOLIN HFA) 108 (90 Base) MCG/ACT inhaler Inhale 2 puffs into the lungs every 6 (six) hours as needed for wheezing or shortness of breath. 3 Inhaler 3  . aspirin 81 MG tablet Take 81 mg by mouth daily.    Marland Kitchen atorvastatin (LIPITOR) 40 MG tablet TAKE 1 TABLET (40 MG TOTAL) BY MOUTH DAILY. 360 tablet 0  . budesonide-formoterol (SYMBICORT) 160-4.5 MCG/ACT inhaler Inhale 2 puffs into the lungs 2 (two) times daily. 1 Inhaler 0  . cetirizine (ZYRTEC) 10 MG tablet Take 10 mg by mouth daily as needed for allergies.    . Cholecalciferol (VITAMIN D) 2000 UNITS CAPS Take 2,000 Units by mouth daily.    . clopidogrel (PLAVIX) 75 MG tablet Take 1 tablet (75 mg total) by mouth daily with breakfast. 365 tablet 0  . fluticasone (FLONASE) 50 MCG/ACT nasal spray Place 2 sprays into both nostrils daily as needed for allergies or rhinitis.    Marland Kitchen glucosamine-chondroitin 500-400 MG tablet Take 1 tablet by mouth  2 (two) times daily.    . Multiple Vitamin (MULTIVITAMIN WITH MINERALS) TABS tablet Take 1 tablet by mouth daily.    . pantoprazole (PROTONIX) 40 MG tablet TAKE 1 TABLET (40 MG TOTAL) BY MOUTH DAILY. TAKE 30-60 MIN BEFORE FIRST MEAL OF THE DAY 30 tablet 2   No current facility-administered medications on file prior to visit.     Allergies  Allergen Reactions  . Penicillins Other (See Comments)    Family history if this allergy Has patient had a PCN reaction causing immediate rash, facial/tongue/throat swelling, SOB or lightheadedness with hypotension: No Has patient had a PCN reaction causing severe rash involving mucus membranes or skin necrosis: No Has patient had a PCN reaction that required hospitalization: No Has patient had a PCN reaction occurring within the last 10 years: No If all of the above answers are "NO", then may proceed with Cephalosporin use.     Blood pressure 105/69, pulse 89, resp. rate 16, height 6\' 2"  (1.88 m), weight 81 kg (178 lb 9.6 oz), SpO2 95 %.  Subjective: The patient presents for routine 2 week follow-up following VATS, decortication  Objective: Cor:RRR, no murmur Pulm: LLL with crackles, CTA in all other fields Abd: soft, non-tender Ext: no edema Wound: left  thoracotomy incision intact with dime sized hematoma. Chest tube incisions are in tact and without drainage.   CLINICAL DATA:  Postop thoracotomy with VATS on 09/19/2016. History of COPD.  EXAM: CHEST  2 VIEW  COMPARISON:  09/23/2016 and 09/22/2016.  FINDINGS: The heart size and mediastinal contours are stable. Interval improvement in loculated pleural effusion on the left and adjacent left lower lobe aeration. No pneumothorax, confluent airspace opacity or significant pleural fluid on the right. Thoracic spine degenerative changes are noted.  IMPRESSION: Improving loculated left pleural effusion and left basilar atelectasis. No pneumothorax.   Electronically Signed   By:  Richardean Sale M.D.   On: 10/09/2016 15:07    Assessment & Plan: Brent Gonzales Is a 71 year old male who presents today for his 2 week post op follow up appointment. On 09/19/2016 he underwent a video-assisted thoracoscopic surgery with decortication. Since the surgery he has been recovering well. He walks most days due to weather. He has been utilizing low free weights for his upper body exercises. He has not done any strenuous exercises. When he left the hospital he did have a fist-sized hematoma underneath his thoracotomy incision which now has reduced to a dime-sized hematoma. He is still on Plavix. He completed a full course of Levaquin due to his positive Streptococcus pneumoniae bronchial washings on admission. He has had no fevers, chills, or night sweats. He has recently been able to move from sleeping in his arm chair to sleeping in his bed. He likely needs a new pulmonologist and we would be happy to make a referral. He has continued on all of his inhaled corticosteroids for COPD. He feels that his shortness of breath is manageable. I reviewed his chest x-ray which revealed an improving left loculated pleural effusion. He does still have some left basilar atelectasis. The full report is above. He may slowly begin to increase his activity level as permitted. The patient asked about golfing which I suggested waiting another few weeks. I also suggested waiting to do pushups due to the intensity of this exercise. He may continue doing light housework and using light weights. He had no further questions at this time. I encouraged him to reach out to our office if he has any questions or concerns. I suggested following up with his cardiologist since the patient was concerned about a slight amount of pedal edema greater on the left than right. Per my evaluation this has significantly improved since admission. He is to follow-up with our office in three months with a chest xray.   Dr. Servando Snare has seen the  patient and agrees with the plan stated above.   Brent Gonzales 10/09/2016

## 2016-10-09 NOTE — Patient Instructions (Addendum)
Make every effort to stay physically active, get some type of exercise on a regular basis, and stick to a "heart healthy diet".  The long term benefits for regular exercise and a healthy diet are critically important to your overall health and wellbeing.  You may return to driving an automobile as long as you are no longer requiring oral narcotic pain relievers during the daytime.  It would be wise to start driving only short distances during the daylight and gradually increase from there as you feel comfortable.  You may follow-up in 3 months with a chest xray. If you have questions or concerns please call our office at:  [phone]  Please follow-up with Cardiology, Pulmonology and Primary care.   Please ob tain a pneumococcal vaccine if you have not been vaccinated.

## 2016-10-22 ENCOUNTER — Encounter: Payer: Self-pay | Admitting: Internal Medicine

## 2016-10-22 ENCOUNTER — Ambulatory Visit (INDEPENDENT_AMBULATORY_CARE_PROVIDER_SITE_OTHER): Payer: Medicare Other | Admitting: Internal Medicine

## 2016-10-22 VITALS — BP 102/60 | HR 78 | Ht 74.0 in | Wt 179.0 lb

## 2016-10-22 DIAGNOSIS — J449 Chronic obstructive pulmonary disease, unspecified: Secondary | ICD-10-CM | POA: Diagnosis not present

## 2016-10-22 DIAGNOSIS — J918 Pleural effusion in other conditions classified elsewhere: Secondary | ICD-10-CM

## 2016-10-22 DIAGNOSIS — J189 Pneumonia, unspecified organism: Secondary | ICD-10-CM

## 2016-10-22 NOTE — Patient Instructions (Signed)
Plan A = Automatic = symbicort 160 (dulera 200) Take 2 puffs first thing in am and then another 2 puffs about 12 hours later.   Work on maintaining perfect  inhaler technique:  relax and gently blow all the way out then take a nice smooth deep breath back in, triggering the inhaler at same time you start breathing in.  Hold for up to 5 seconds if you can. Blow out thru nose. Rinse and gargle with water when done     Plan B = Backup Only use your albuterol as a rescue medication to be used if you can't catch your breath by resting or doing a relaxed purse lip breathing pattern.  - The less you use it, the better it will work when you need it. - Ok to use the inhaler up to 2 puffs  every 4 hours if you must but call for appointment if use goes up over your usual need - Don't leave home without it !!  (think of it like the spare tire for your car)    Please schedule a follow up visit in 6  months but call sooner if needed

## 2016-10-22 NOTE — Progress Notes (Signed)
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 Pulmonary Consult    Dr. Dennard Schaumann referred him here, he was dx'd with COPD back in the fall, been taking Anoro and Pro Air, a cold caused him constant drainage with coughing and sneezing with SOB, he end up in the hospital, stopped smoking 2 weeks ago  about 50 % better since in ER but still with sense of throat and chest congestion more day than noct assoc with doe = MMRC2 = can't walk a nl pace on a flat grade s sob but does fine slow and flat eg shopping ok rec Plan A = Automatic = Symbicort 160 Take 2 puffs first thing in am and then another 2 puffs about 12 hours later.  Plan B = Backup Only use your albuterol as a rescue medication  Pantoprazole (protonix) 40 mg   Take  30-60 min before first meal of the day and Pepcid (famotidine)  20 mg one @  bedtime  GERD  Please schedule a follow up office visit in 4 weeks, sooner if needed with pfts     07/19/2016  f/u ov/Brent Gonzales re: copd II on symb 160 2 bid / gerd dr  Chief Complaint  Patient presents with  . Follow-up    He has been coughing less but having chest tightness. He uses his albuterol inhaler 2-3 x per day. Started smoking again daily.   overall better despite smoking and reported chest tightness that resolves immediately with albuterol  - cough is assoc with nasal congestion and sense of pnds  rec Please see patient coordinator  before you leave today to schedule sinus CT  No change in medications The key is to stop smoking completely before smoking completely stops you - it's not too late as you only have mild copd  Please schedule a follow up visit in 3 months but call sooner if needed  with all medications /inhalers/ solutions in hand so we can verify exactly what you are taking. This includes all medications from all doctors and over the counters     Admit date: 08/28/2016 Discharge date: 08/29/2016   Brief/Interim Summary: HPI: Brent Gonzales a 71 y.o.malewith medical history significant for CAD and other vascular disease, COPD followed by Dr. Melvyn Novas as an outpatient,continued tobacco abuse, dyslipidemia and hypertension. Patient reports 1 week ago he developed generalized malaise with pleuritic type pain just below the left rib cage radiating to the left shoulder. This was associated with mild nonproductive cough. He also felt like because of the pain he could not get in a good deep breath. He had a MAXIMUM TEMPERATURE of 102F last week. He has been very fatigued. He has had adequate oral intake. He has not had nausea vomiting or diarrhea. No sick contacts. He is been utilizing Tylenol and  ibuprofen for the symptoms without improvement.  ED Course: Vital Signs: BP 123/75  Pulse 92  Temp 98.7 F (37.1 C) (Oral)  Resp (!) 24  SpO2 91%  2 view CXR:NEW LEFTlower lobe atelectasis/pneumonia with small left pleural effusion and underlying COPD CT abdomen and pelvis with contrast:Left lower lobe pneumonia with atelectasis and small effusion, mild right lower lobe atelectasis and possible pneumonia, mild upper abdominal adenopathy likely reactive in the absence of visible neoplasm Lab data: Sodium 137, potassium 3.9, chloride 105, CO2 23, glucose 166, BUN 14, creatinine 1.11, albumin 3, AST 56, ALT 66, lactic acid 0.78, white count 13,600 with neutrophils 87% and absolute neutrophils 1.8%, hemoglobin 14.1,  platelets 283,000 Medications and treatments:Fentanyl 50 g IV 1, Solu-Medrol 125 mg IV 1, Levaquin 750 mg IV 1  Acute respiratory failure with hypoxia 2/2CAP (community acquired pneumonia) -Presents with pleuritic chest pain with generalized malaise fevers and shortness of breath 1 week with radiographic evidence of bilateral pneumonia-also leukocytosis -Treat as community-acquired pneumonia:Levaquin -final blood cultures and sputum culture pending  -Respiratory viral panel positive for rhinovirus/enterovirus -Supportive care with oxygen-wean as tolerated-may benefit from short-term home O2 to decrease length of stay, will evaluate for home oxygen prior to discharge -IV Toradol was ordered for pleuritic chest discomfort (continue PPI) -Given IV fluids at 75 per hour-also received IV contrast for CT  Transaminitis -Likely secondary to acute illness -Held statin -Repeat LFTs within normal limits  COPD (chronic obstructive pulmonary disease)  -Not actively wheezing and does not appear to be expressing acute exacerbation -Follow up with pulmonologist after discharge -Continue home Symbicort and albuterol MDI  HTN (hypertension) -Moderately controlled -Not on antihypertensive therapy prior to admission, follow up with PCP  HLD (hyperlipidemia) -Hold statin in setting of mild transaminitis -CK normal  CAD (coronary artery disease) -Currently chest pain not consistent with prior ischemic pain-patient instructed to notify staff if develops typical ischemic pain -Continue preadmission low dose aspirin -Statin on hold as above -Continue Plavix -Was not on beta blocker prior to admission  DVT prophylaxis:Lovenox Code Status:Full Family Communication:Wife Disposition Plan:Home  Discharge Diagnoses:  Principal Problem:   CAP (community acquired pneumonia) Active Problems:   Acute respiratory failure with hypoxia ([facility])   COPD (chronic obstructive  pulmonary disease) ([facility])   HTN (hypertension)   HLD (hyperlipidemia)   CAD (coronary artery disease)   Transaminitis   Lobar pneumonia ([place])    09/07/2016  Acute extended  ov/Brent Gonzales re: transition of care/ post hosp ov/ sob getting worse  Chief Complaint  Patient presents with  . Acute Visit    Pt states recent admit to hospital for PNA 08/28/16 to 08/29/16. He states the pain he had been having is almost gone, but his breathing has been worse for the past 2-3 days. He is not coughing much.   had been improving but several days prior to [place] onset gradually  worse doe and also orthopnea but no cp or purulent sputum and doe = MMRC3 = can't walk 100 yards even at a slow pace at a flat grade s stopping due to sob   Imp/plan: - Late parapneumonic effusion   with ESR  129  > tap  09/07/2016 x 100 cc only,  Wbc 300 with L > P exudate / nl glucose > refer to T surgery 09/10/17 >  09/19/2016 LEFT VIDEO ASSISTED THORACOSCOPY (VATS)/DECORTICATION (Left)    10/22/2016  f/u ov/Brent Gonzales re: GOLD II  Not using symb first thing  But rather saba bid  Chief Complaint  Patient presents with  . Follow-up    Breathing has improved, getting closer to his normal baseline. He is using his albuterol inhaler 1-2 x per day on average.    doe= MMRC2 = can't walk a nl pace on a flat grade s sob but does fine slow and flat anywhere he wants to go     No obvious day to day or daytime variability or assoc excess/ purulent sputum or mucus plugs or hemoptysis or cp or chest tightness, subjective wheeze or overt sinus or hb symptoms. No unusual exp hx or h/o childhood pna/ asthma or knowledge of premature birth.  Sleeping ok without nocturnal  or early am exacerbation  of respiratory  c/o's or need for noct saba. Also denies any obvious fluctuation of symptoms with weather or environmental changes or other aggravating or alleviating factors except as outlined above   Current Medications, Allergies, Complete Past Medical  History, Past Surgical History, Family History, and Social History were reviewed in [employer] record.  ROS  The following are not active complaints unless bolded sore throat, dysphagia, dental problems, itching, sneezing,  nasal congestion or excess/ purulent secretions, ear ache,   fever, chills, sweats, unintended wt loss, classically pleuritic or exertional cp,  orthopnea pnd or leg swelling, presyncope, palpitations, abdominal pain, anorexia, nausea, vomiting, diarrhea  or change in bowel or bladder habits, change in stools or urine, dysuria,hematuria,  rash, arthralgias, visual complaints, headache, numbness, weakness or ataxia or problems with walking or coordination,  change in mood/affect or memory.                   Objective:   Physical Exam  amb wm nad   10/22/2016         09/07/2016        191   07/19/2016       189   06/21/16 186 lb (84.4 kg)  06/13/16 180 lb (81.6 kg)  09/08/15 188 lb (85.3 kg)    Vital signs reviewed   - Note on arrival 02 sats   94% on RA     HEENT: nl dentition,  , and oropharynx. Nl external ear canals without cough reflex - very mild  bilateral non-specific turbinate edema    NECK :  without JVD/Nodes/TM/ nl carotid upstrokes bilaterally   LUNGS: no acc muscle use,    slt decreased bs diffusely esp LLL but no wheezing at all   CV:  RRR  no s3 or murmur or increase in P2, and no edema   ABD:  soft and nontender with nl inspiratory excursion in the supine position. No bruits or organomegaly appreciated, bowel sounds nl  MS:  Nl gait/ ext warm without deformities, calf tenderness, cyanosis -  Mild  bilateral clubbing No obvious joint restrictions   SKIN: warm and dry without lesions    NEURO:  alert,   nl sensorium with  no motor or cerebellar deficits apparent.        I personally reviewed images and agree with radiology impression as follows:  CXR:   10/09/16 Improving loculated left pleural effusion and left  basilar atelectasis. No pneumothorax.     Assessment & Plan:

## 2016-10-22 NOTE — Assessment & Plan Note (Addendum)
Quit smoking 05/2016 - Spirometry 06/21/2016  FEV1 1.95 (52%)  Ratio 66 p anoro and saba   -  06/21/2016    try symbicort 160 2bid  - PFT's  07/19/2016  FEV1 2.16 (58 % ) ratio 65  p 2 % improvement from saba p symb 160 prior to study with DLCO  43 % corrects to 65  % for alv volume - Sinus CT 07/21/16 Mild-to-moderate mucosal thickening of the right maxillary sinus  - 10/22/2016  After extensive coaching HFA effectiveness =  90%   Now that effusion improving he's getting back near baseline  But overusing saba because he didn't really understand the difference between laba/ics and saba  I had an extended discussion with the patient reviewing all relevant studies completed to date and  lasting 15 to 20 minutes of a 25 minute visit    Formulary restrictions will be an ongoing challenge for the forseable future and I would be happy to pick an alternative if the pt will first  provide me a list of them but pt  will need to return here for training for any new device that is required eg dpi vs hfa vs respimat.    In meantime we can always provide samples so the patient never runs out of any needed respiratory medications.   Each maintenance medication was reviewed in detail including most importantly the difference between maintenance and prns and under what circumstances the prns are to be triggered using an action plan format that is not reflected in the computer generated alphabetically organized AVS.    Please see AVS for specific instructions unique to this visit that I personally wrote and verbalized to the the pt in detail and then reviewed with pt  by my nurse highlighting any  changes in therapy recommended at today's visit to their plan of care.

## 2016-10-22 NOTE — Assessment & Plan Note (Addendum)
See admit 08/28/16 - Late parapneumonic effusion 09/07/2016 with ESR  129  > tap  09/07/2016 x 100 cc only,  Wbc 300 with L > P exudate / nl glucose > refer to T surgery 09/10/17 >  09/19/2016 LEFT VIDEO ASSISTED THORACOSCOPY (VATS)/DECORTICATION (Left) with neg path  Note that pleural effusion and copd have the same effect on insp muscles/mechanics (both shorten their length prior to inspiration making them weaker with less force reserve) so they are synergistic in causing sob.  Now that the effusion is resolving he's getting back to baseline an if still not there on return in 6 m will  consider adding lama or change to lama / laba   In meantime will f/u with Dr Darnell Level until released

## 2016-11-01 LAB — ACID FAST CULTURE WITH REFLEXED SENSITIVITIES (MYCOBACTERIA): Acid Fast Culture: NEGATIVE

## 2016-12-17 ENCOUNTER — Other Ambulatory Visit: Payer: Self-pay | Admitting: Internal Medicine

## 2017-01-09 ENCOUNTER — Other Ambulatory Visit: Payer: Self-pay | Admitting: Cardiothoracic Surgery

## 2017-01-09 DIAGNOSIS — I25119 Atherosclerotic heart disease of native coronary artery with unspecified angina pectoris: Secondary | ICD-10-CM

## 2017-01-10 ENCOUNTER — Ambulatory Visit (INDEPENDENT_AMBULATORY_CARE_PROVIDER_SITE_OTHER): Payer: Medicare Other | Admitting: Cardiothoracic Surgery

## 2017-01-10 ENCOUNTER — Ambulatory Visit
Admission: RE | Admit: 2017-01-10 | Discharge: 2017-01-10 | Disposition: A | Discharge status: Home / Self Care | Payer: Medicare Other | Source: Ambulatory Visit | Attending: Cardiothoracic Surgery | Admitting: Cardiothoracic Surgery

## 2017-01-10 ENCOUNTER — Encounter: Payer: Self-pay | Admitting: Cardiothoracic Surgery

## 2017-01-10 VITALS — BP 129/74 | HR 76 | Resp 20 | Ht 74.0 in | Wt 186.0 lb

## 2017-01-10 DIAGNOSIS — Z09 Encounter for follow-up examination after completed treatment for conditions other than malignant neoplasm: Secondary | ICD-10-CM | POA: Diagnosis not present

## 2017-01-10 DIAGNOSIS — J869 Pyothorax without fistula: Secondary | ICD-10-CM

## 2017-01-10 DIAGNOSIS — J918 Pleural effusion in other conditions classified elsewhere: Secondary | ICD-10-CM | POA: Diagnosis not present

## 2017-01-10 DIAGNOSIS — I25119 Atherosclerotic heart disease of native coronary artery with unspecified angina pectoris: Secondary | ICD-10-CM

## 2017-01-10 DIAGNOSIS — J189 Pneumonia, unspecified organism: Secondary | ICD-10-CM

## 2017-01-10 DIAGNOSIS — J90 Pleural effusion, not elsewhere classified: Secondary | ICD-10-CM | POA: Diagnosis not present

## 2017-01-10 NOTE — Progress Notes (Signed)
[address]       [place] [address]             [phone]      Chadwin L Marchio [facility] Medical Record #[id number] Date of Birth: 04-May-1945  Referring: Tanda Rockers, MD Primary Care: [name], MD  Chief Complaint:   POST OP FOLLOW UP 09/19/2016  OPERATIVE REPORT PREOPERATIVE DIAGNOSIS:  Left empyema. POSTOPERATIVE DIAGNOSIS:  Left empyema. SURGICAL PROCEDURES:  Bronchoscopy, left video-assisted thoracoscopy, decortication. SURGEON:  Lanelle Bal, MD  Diagnosis Pleura, peel, Left - GRANULATION TISSUE AND FIBRIN WITH CHRONIC INFLAMMATION - NO MALIGNANCY IDENTIFIED DAWN BUTLER MD Pathologist, Electronic Signature (Case signed 09/20/2016) History of Present Illness:      Patient doing well following drainage of empyema in May 2018. He's had no recurrent episodes of pulmonary infections. Notes she's been relatively active including breaking 90 on his golf game several days ago. He notes he is up-to-date on his flu vaccination and pneumococcal vaccination.    Past Medical History:  Diagnosis Date  . Aorto-iliac disease ([place])    mild bilateral external. ABIs in the 0.9 range  . Carotid artery disease ([place])    right ICA stenosis  . COPD (chronic obstructive pulmonary disease) ([place])   . Coronary artery disease   . Empyema ([place])   . High cholesterol   . Hyperlipidemia   . Hypertension    no longer- on medications  . Pneumonia 08/2016  . Tobacco abuse      History  Smoking Status  . Former Smoker  . Packs/day: 0.50  . Years: 45.00  . Quit date: 08/21/2016  Smokeless Tobacco  . Never Used    History  Alcohol Use  . 4.2 oz/week  . 7 Cans of beer per week     Allergies  Allergen Reactions  . Penicillins Other (See Comments)    Family history if this allergy Has patient had a PCN reaction causing immediate rash, facial/tongue/throat swelling, SOB or lightheadedness with hypotension: No Has patient had a PCN  reaction causing severe rash involving mucus membranes or skin necrosis: No Has patient had a PCN reaction that required hospitalization: No Has patient had a PCN reaction occurring within the last 10 years: No If all of the above answers are "NO", then may proceed with Cephalosporin use.     Current Outpatient Prescriptions  Medication Sig Dispense Refill  . acetaminophen (TYLENOL) 500 MG tablet Take 1,000 mg by mouth 2 (two) times daily as needed for mild pain or headache.     . albuterol (PROVENTIL HFA;VENTOLIN HFA) 108 (90 Base) MCG/ACT inhaler Inhale 2 puffs into the lungs every 6 (six) hours as needed for wheezing or shortness of breath. 3 Inhaler 3  . aspirin 81 MG tablet Take 81 mg by mouth daily.    Marland Kitchen atorvastatin (LIPITOR) 40 MG tablet TAKE 1 TABLET (40 MG TOTAL) BY MOUTH DAILY. 360 tablet 0  . budesonide-formoterol (SYMBICORT) 160-4.5 MCG/ACT inhaler Inhale 2 puffs into the lungs 2 (two) times daily. 1 Inhaler 0  . cetirizine (ZYRTEC) 10 MG tablet Take 10 mg by mouth daily as needed for allergies.    . Cholecalciferol (VITAMIN D) 2000 UNITS CAPS Take 2,000 Units by mouth daily.    . clopidogrel (PLAVIX) 75 MG tablet Take 1 tablet (75 mg total) by mouth daily with breakfast. 365 tablet 0  . Coenzyme Q10 (CO Q 10 PO) Take 200 mg by mouth daily.    Marland Kitchen  docusate sodium (COLACE) 100 MG capsule Take 100 mg by mouth daily.    . fluticasone (FLONASE) 50 MCG/ACT nasal spray Place 2 sprays into both nostrils daily as needed for allergies or rhinitis.    Marland Kitchen glucosamine-chondroitin 500-400 MG tablet Take 1 tablet by mouth 2 (two) times daily.    Marland Kitchen KRILL OIL PO Take 1 capsule by mouth daily.    . Multiple Vitamin (MULTIVITAMIN WITH MINERALS) TABS tablet Take 1 tablet by mouth daily.    . pantoprazole (PROTONIX) 40 MG tablet TAKE 1 TABLET (40 MG TOTAL) BY MOUTH DAILY. TAKE 30-60 MIN BEFORE FIRST MEAL OF THE DAY 30 tablet 3   No current facility-administered medications for this visit.         Physical Exam: BP 129/74   Pulse 76   Resp 20   Ht 6\' 2"  (1.88 m)   Wt 186 lb (84.4 kg)   SpO2 93% Comment: RA  BMI 23.88 kg/m   General appearance: alert and cooperative Neurologic: intact Heart: regular rate and rhythm, S1, S2 normal, no murmur, click, rub or gallop Lungs: clear to auscultation bilaterally Abdomen: soft, non-tender; bowel sounds normal; no masses,  no organomegaly Extremities: extremities normal, atraumatic, no cyanosis or edema and Homans sign is negative, no sign of DVT Wound: Left chest incisions are well-healed   Diagnostic Studies & Laboratory data:     Recent Radiology Findings:   Dg Chest 2 View  Result Date: 01/10/2017 CLINICAL DATA:  Previous left-sided VATS EXAM: CHEST  2 VIEW COMPARISON:  10/09/2016 FINDINGS: Cardiac shadow is stable. The lungs are well aerated bilaterally. Minimal scarring is again seen in the left base but stable. No sizable effusion is seen at this time. Degenerative changes of thoracic spine are noted. IMPRESSION: Mild scarring in the left base without evidence of persistent pleural effusion. No new focal abnormality is seen. Electronically Signed   By: Inez Catalina M.D.   On: 01/10/2017 10:27      Recent Lab Findings: Lab Results  Component Value Date   WBC 10.7 09/28/2016   HGB 11.8 (L) 09/28/2016   HCT 36.2 (L) 09/28/2016   PLT 408 (H) 09/28/2016   GLUCOSE 94 09/28/2016   CHOL 124 (L) 03/31/2015   TRIG 123 03/31/2015   HDL 32 (L) 03/31/2015   LDLCALC 67 03/31/2015   ALT 19 09/28/2016   AST 18 09/28/2016   NA 139 09/28/2016   K 4.5 09/28/2016   CL 103 09/28/2016   CREATININE 1.08 09/28/2016   BUN 12 09/28/2016   CO2 24 09/28/2016   TSH 2.254 06/21/2014   INR 1.17 09/19/2016      Assessment / Plan:      Stable following left chest empyema drainage and decortication, current chest x-ray shows clearing without residual pleural effusion and only mild scarring at the left base. Patient is followed in  the pulmonary clinic I have not made a return appointment for in the seen in the surgery office.      Grace Isaac MD      [place] ,[place] [address] Office [phone]   Beeper [phone]  01/10/2017 10:54 AM

## 2017-02-20 ENCOUNTER — Other Ambulatory Visit: Payer: Self-pay | Admitting: Family Medicine

## 2017-02-20 DIAGNOSIS — J439 Emphysema, unspecified: Secondary | ICD-10-CM

## 2017-04-20 ENCOUNTER — Other Ambulatory Visit: Payer: Self-pay | Admitting: Internal Medicine

## 2017-04-25 ENCOUNTER — Ambulatory Visit: Payer: Medicare Other | Admitting: Internal Medicine

## 2017-04-25 ENCOUNTER — Encounter: Payer: Self-pay | Admitting: Internal Medicine

## 2017-04-25 VITALS — BP 126/78 | HR 82 | Ht 74.0 in | Wt 197.2 lb

## 2017-04-25 DIAGNOSIS — J449 Chronic obstructive pulmonary disease, unspecified: Secondary | ICD-10-CM | POA: Diagnosis not present

## 2017-04-25 DIAGNOSIS — F17210 Nicotine dependence, cigarettes, uncomplicated: Secondary | ICD-10-CM

## 2017-04-25 DIAGNOSIS — R05 Cough: Secondary | ICD-10-CM | POA: Diagnosis not present

## 2017-04-25 DIAGNOSIS — R058 Other specified cough: Secondary | ICD-10-CM

## 2017-04-25 MED ORDER — BUDESONIDE-FORMOTEROL FUMARATE 160-4.5 MCG/ACT IN AERO: 2.0000 | INHALATION_SPRAY | Freq: Two times a day (BID) | RESPIRATORY_TRACT | 0 refills | Status: AC

## 2017-04-25 NOTE — Patient Instructions (Addendum)
Add pepcid 20 mg one at bedtime to your present regimen - automatically for a month   GERD (REFLUX)  is an extremely common cause of respiratory symptoms just like yours , many times with no obvious heartburn at all.    It can be treated with medication, but also with lifestyle changes including elevation of the head of your bed (ideally with 6 inch  bed blocks),  Smoking cessation, avoidance of late meals, excessive alcohol, and avoid fatty foods, chocolate, peppermint, colas, red wine, and acidic juices such as orange juice.  NO MINT OR MENTHOL PRODUCTS SO NO COUGH DROPS   USE SUGARLESS CANDY INSTEAD (Jolley ranchers or Stover's or Life Savers) or even ice chips will also do - the key is to swallow to prevent all throat clearing. NO OIL BASED VITAMINS - use powdered substitutes.    Try afrin 12 hour one - two  puff in each nostril about 2 min before flonase x 3 days then stop - as long as you let you nose recover x 5 days you can repeat    Call me if you want to see an ent     If you are satisfied with your treatment plan,  let your doctor know and he/she can either refill your medications or you can return here when your prescription runs out.     If in any way you are not 100% satisfied,  please tell us.  If 100% better, tell your friends!  Pulmonary follow up is as needed                   .

## 2017-04-25 NOTE — Progress Notes (Signed)
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 Pulmonary Consult    Dr. Dennard Gonzales referred him here, he was dx'd with COPD back in the fall, been taking Anoro and Pro Air, a cold caused him constant drainage with coughing and sneezing with SOB, he end up in the hospital, stopped smoking 2 weeks ago  about 50 % better since in ER but still with sense of throat and chest congestion more day than noct assoc with doe = MMRC2 = can't walk a nl pace on a flat grade s sob but does fine slow and flat eg shopping ok rec Plan A = Automatic = Symbicort 160 Take 2 puffs first thing in am and then another 2 puffs about 12 hours later.  Plan B = Backup Only use your albuterol as a rescue medication  Pantoprazole (protonix) 40 mg   Take  30-60 min before first meal of the day and Pepcid (famotidine)  20 mg one @  bedtime  GERD  Please schedule a follow up office visit in 4 weeks, sooner if needed with pfts      10/22/2016  f/u ov/Brent Gonzales re: GOLD II  Not using symb first thing  But rather saba bid  Chief Complaint  Patient presents with  . Follow-up    Breathing has improved, getting closer to his normal baseline. He is using his albuterol inhaler 1-2 x per day on average.    doe= MMRC2 = can't walk a nl pace on a flat grade s sob but does fine slow and flat anywhere he wants to go   rec Plan A = Automatic = symbicort 160  (dulera 200) Take 2 puffs first thing in am and then another 2 puffs about 12 hours later.  Work on maintaining perfect  inhaler technique:   Plan B = Backup Only use your albuterol as a rescue medication     04/25/2017  f/u ov/Brent Gonzales re: COPD GOLD II/ symb and back smoking/ main complaints are nasal  Chief Complaint  Patient presents with  . Follow-up    Pt states that he has been doing good since last visit. States that his breathing is at a stable point. Has occ. SOB with activities, occ. cough. Denies any CP.  nasal symptoms for decades esp in last 10 y prior to [place]  Nearly every night p flonase bedtime wakes up 6 h later with clogged up nose R > L  Unusual timing on symb 11-11 Really Not limited by breathing from desired activities    No obvious day to day or daytime variability or assoc excess/ purulent sputum or mucus plugs or hemoptysis or cp or chest tightness, subjective wheeze or overt   hb symptoms. No unusual exposure hx or h/o childhood pna/ asthma  or knowledge of premature birth.    Also denies any obvious fluctuation of symptoms with weather or environmental changes or other aggravating or alleviating factors except as outlined above   Current Allergies, Complete Past Medical History, Past Surgical History, Family History, and Social History were reviewed in [employer] record.  ROS  The following are not active complaints unless bolded Hoarseness, sore throat, dysphagia, dental problems, itching, sneezing,  nasal congestion or discharge of excess mucus or purulent secretions, ear ache,   fever, chills, sweats, unintended wt loss or wt gain, classically pleuritic or exertional cp,  orthopnea pnd or leg swelling, presyncope, palpitations, abdominal pain, anorexia, nausea, vomiting, diarrhea  or change in bowel habits or change in bladder habits, change in stools or change in urine, dysuria, hematuria,  rash, arthralgias, visual complaints, headache, numbness,  weakness or ataxia or problems with walking or coordination,  change in mood/affect or memory.        Current Meds  Medication Sig  . acetaminophen (TYLENOL) 500 MG tablet Take 1,000 mg by mouth 2 (two) times daily as needed for mild pain or headache.   . albuterol (PROVENTIL HFA;VENTOLIN HFA) 108 (90 Base) MCG/ACT inhaler Inhale 2 puffs into the lungs every 6 (six) hours as needed for wheezing or shortness of breath.  Marland Kitchen aspirin 81 MG tablet Take 81 mg by mouth daily.  Marland Kitchen atorvastatin (LIPITOR) 40 MG tablet TAKE 1 TABLET (40 MG TOTAL) BY MOUTH DAILY.  . budesonide-formoterol (SYMBICORT) 160-4.5 MCG/ACT inhaler Inhale 2 puffs into the lungs 2 (two) times daily.  . cetirizine (ZYRTEC) 10 MG tablet Take 10 mg by mouth daily as needed for allergies.  . Cholecalciferol (VITAMIN D) 2000 UNITS CAPS Take 2,000 Units by mouth daily.  . clopidogrel (PLAVIX) 75 MG tablet Take 1 tablet (75 mg total) by mouth daily with breakfast.  . Coenzyme Q10 (CO Q 10 PO) Take 200 mg by mouth daily.  Marland Kitchen docusate sodium (COLACE) 100 MG capsule Take 100 mg by mouth daily.  . fluticasone (FLONASE) 50 MCG/ACT nasal spray Place 2 sprays into both nostrils daily as needed for allergies or rhinitis.  Marland Kitchen glucosamine-chondroitin 500-400 MG tablet Take 1 tablet by mouth 2 (two) times daily.  Marland Kitchen KRILL OIL PO Take 1 capsule by mouth daily.  . Multiple Vitamin (MULTIVITAMIN WITH MINERALS) TABS tablet Take 1 tablet by mouth daily.  . pantoprazole (PROTONIX) 40 MG tablet TAKE 1 TABLET (40 MG TOTAL) BY MOUTH DAILY. TAKE 30-60 MIN BEFORE FIRST MEAL OF THE DAY  .   budesonide-formoterol (SYMBICORT) 160-4.5 MCG/ACT inhaler Inhale 2 puffs into the lungs 2 (two) times daily.                           Objective:   Physical Exam  Somber wm nad  04/25/2017          197  09/07/2016        191   07/19/2016       189   06/21/16 186 lb (84.4 kg)  06/13/16 180 lb (81.6 kg)  09/08/15 188 lb (85.3 kg)     Vital signs reviewed - Note on  arrival 02 sats  95% on RA          HEENT: nl dentition,   and oropharynx. Nl external ear canals without cough reflex - moderate bilateral non-specific turbinate edema    NECK :  without JVD/Nodes/TM/ nl carotid upstrokes bilaterally   LUNGS:  no acc muscle use,  Nl contour chest with minimal insp/exp rhonchi bilaterally    CV:  RRR  no s3 or murmur or increase in P2, and mild bilateral clubbing   ABD:  soft and nontender with nl inspiratory excursion in the supine position. No bruits or organomegaly appreciated, bowel sounds nl  MS:  Nl gait/ ext warm without deformities, calf tenderness, cyanosis or clubbing No obvious joint restrictions   SKIN: warm and dry without lesions    NEURO:  alert, approp, nl sensorium with  no motor or cerebellar deficits apparent.                     Assessment & Plan:

## 2017-04-28 ENCOUNTER — Encounter: Payer: Self-pay | Admitting: Internal Medicine

## 2017-04-28 NOTE — Assessment & Plan Note (Signed)
Quit smoking 05/2016 - Spirometry 06/21/2016  FEV1 1.95 (52%)  Ratio 66 p anoro and saba   -  06/21/2016    try symbicort 160 2bid  - PFT's  07/19/2016  FEV1 2.16 (58 % ) ratio 65  p 2 % improvement from saba p symb 160 prior to study with DLCO  43 % corrects to 65  % for alv volume - Sinus CT 07/21/16 Mild-to-moderate mucosal thickening of the right maxillary sinus - 10/22/2016  After extensive coaching HFA effectiveness =  90%  Despite resumed smoking relatively well compensated on symb 160 2 bid   Each maintenance medication was reviewed in detail including most importantly the difference between maintenance and as needed and under what circumstances the prns are to be used.  Please see AVS for specific  Instructions which are unique to this visit and I personally typed out  which were reviewed in detail in writing with the patient and a copy provided.

## 2017-04-28 NOTE — Assessment & Plan Note (Signed)
-   Sinus CT 07/21/16 Mild-to-moderate mucosal thickening of the right maxillary sinus  Advised on limited use of afrin > avoid smoke exp / f/u with  PCP/ ent prn

## 2017-04-28 NOTE — Assessment & Plan Note (Signed)

## 2017-04-29 ENCOUNTER — Ambulatory Visit (HOSPITAL_COMMUNITY)
Admission: RE | Admit: 2017-04-29 | Discharge: 2017-04-29 | Disposition: A | Discharge status: Home / Self Care | Payer: Medicare Other | Source: Ambulatory Visit | Attending: Internal Medicine | Admitting: Internal Medicine

## 2017-04-29 DIAGNOSIS — I6523 Occlusion and stenosis of bilateral carotid arteries: Secondary | ICD-10-CM | POA: Diagnosis not present

## 2017-04-29 DIAGNOSIS — I779 Disorder of arteries and arterioles, unspecified: Secondary | ICD-10-CM

## 2017-04-29 DIAGNOSIS — I739 Peripheral vascular disease, unspecified: Secondary | ICD-10-CM

## 2017-06-03 ENCOUNTER — Other Ambulatory Visit: Payer: Self-pay | Admitting: Family Medicine

## 2017-06-03 DIAGNOSIS — J439 Emphysema, unspecified: Secondary | ICD-10-CM

## 2017-06-04 IMAGING — CT CT PARANASAL SINUSES LIMITED
1 of 2 series · 9 of 12 positions shown, 12 images · non-contrast
Comparison: None.

CLINICAL DATA: Difficulty breathing through the right nostril

EXAM:
CT PARANASAL SINUS LIMITED WITHOUT CONTRAST
TECHNIQUE: Non-contiguous multidetector CT images of the paranasal sinuses were
obtained in a single plane without contrast.

[Series 4: limited sinus st · axial · 0.39mm/px · z∈[-79,+1]mm · 9 of 11 slices shown, 12 images]
[im 2/11  brain]
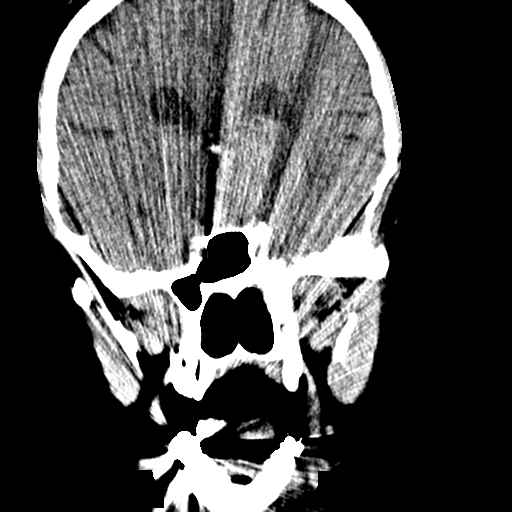
[im 2/11  bone]
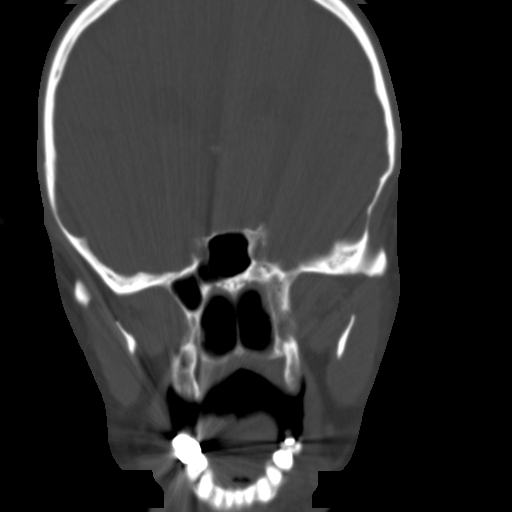
[im 3/11  bone]
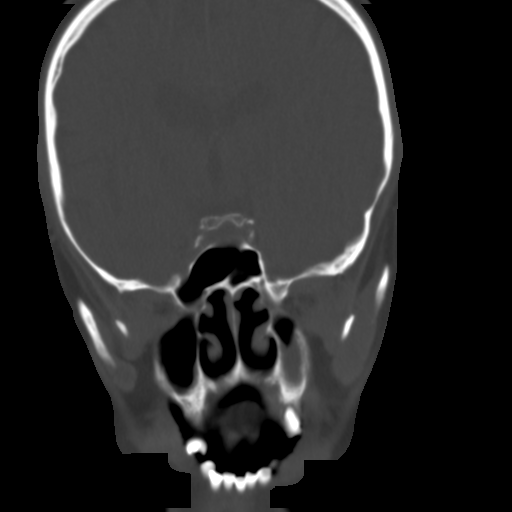
[im 4/11  bone]
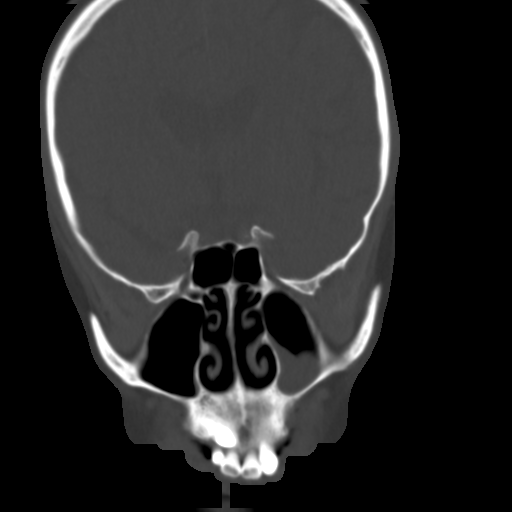
[im 5/11  bone]
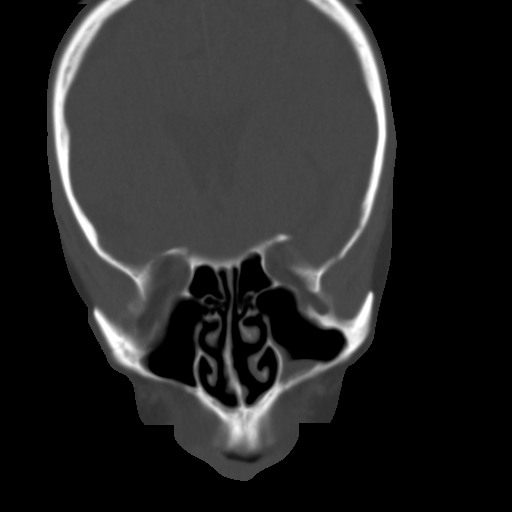
[im 6/11  brain]
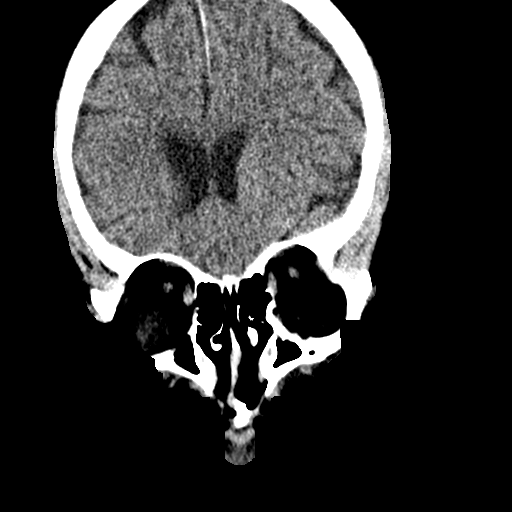
[im 6/11  bone]
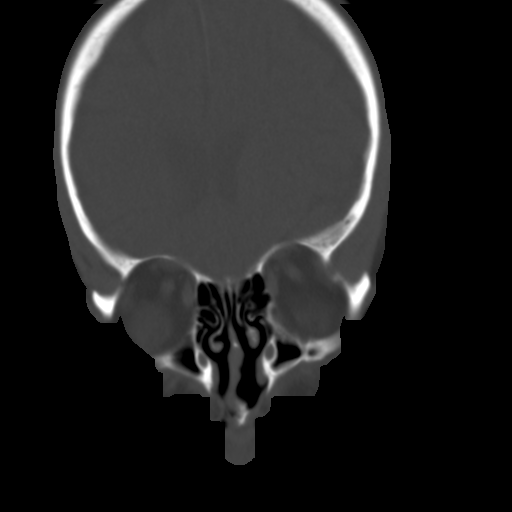
[im 7/11  bone]
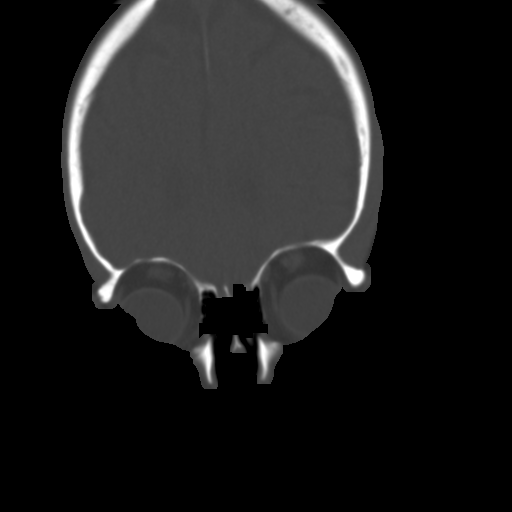
[im 8/11  bone]
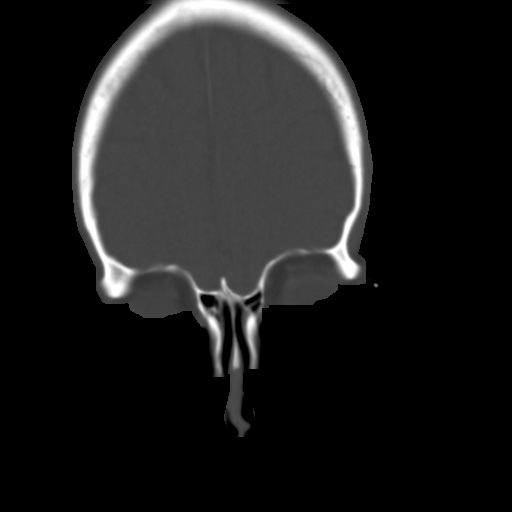
[im 9/11  bone]
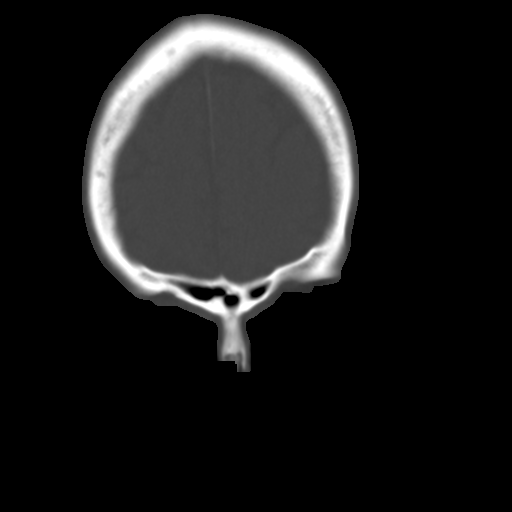
[im 10/11  brain]
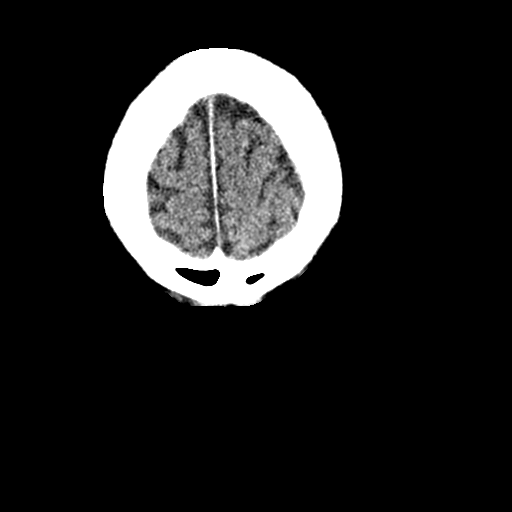
[im 10/11  bone]
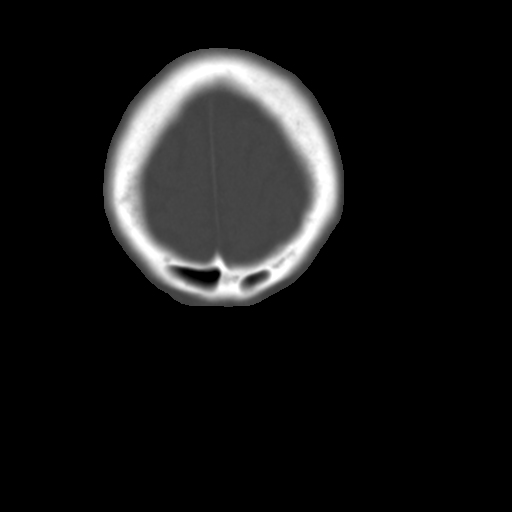

[9 of 12 positions shown; findings below may reference images not displayed]

FINDINGS: Mild-to-moderate mucosal thickening along the floor of the right
maxillary sinus is identified. The visualized paranasal sinuses are
otherwise clear without air-fluid levels. Turbinates are not
engorged. No space-occupying mass of the paranasal sinuses are
identified. Temporomandibular joints are well-seated bilaterally.
The visualized orbits and globes are nonacute.
IMPRESSION: Mild-to-moderate mucosal thickening of the right maxillary sinus.

## 2017-06-25 ENCOUNTER — Other Ambulatory Visit: Payer: Self-pay | Admitting: *Deleted

## 2017-06-25 ENCOUNTER — Telehealth: Payer: Self-pay | Admitting: Cardiovascular Disease

## 2017-06-25 MED ORDER — CLOPIDOGREL BISULFATE 75 MG PO TABS: 75.0000 mg | ORAL_TABLET | Freq: Every day | ORAL | 0 refills | Status: DC

## 2017-06-25 NOTE — Telephone Encounter (Signed)
New Message       *STAT* If patient is at the pharmacy, call can be transferred to refill team.   1. Which medications need to be refilled? (please list name of each medication and dose if known)  clopidogrel (PLAVIX) 75 MG tablet Take 1 tablet (75 mg total) by mouth daily with breakfast.     2. Which pharmacy/location (including street and city if local pharmacy) is medication to be sent to? [facility] [place]   3. Do they need a 30 day or 90 day supply?  [place]

## 2017-07-15 ENCOUNTER — Other Ambulatory Visit: Payer: Self-pay | Admitting: Internal Medicine

## 2017-07-15 MED ORDER — PANTOPRAZOLE SODIUM 40 MG PO TBEC: 40.0000 mg | DELAYED_RELEASE_TABLET | Freq: Every day | ORAL | 0 refills | Status: AC

## 2017-07-25 ENCOUNTER — Other Ambulatory Visit: Payer: Self-pay | Admitting: Internal Medicine

## 2017-07-25 ENCOUNTER — Telehealth: Payer: Self-pay | Admitting: Internal Medicine

## 2017-07-25 NOTE — Telephone Encounter (Signed)
Called patient, unable to reach. Will wait to send refill after speaking to patient so that pharmacy can be verified.

## 2017-07-26 NOTE — Telephone Encounter (Signed)
Left voice mail on machine for patient to return phone call back regarding medication refill requested. X2

## 2017-07-29 NOTE — Telephone Encounter (Signed)
Attempted to call patient, no answer, left message to call back. Per triage protocol will close encounter since it has been multiple unsuccessful attempts to reach patient.

## 2017-08-05 ENCOUNTER — Ambulatory Visit (HOSPITAL_COMMUNITY)
Admission: RE | Admit: 2017-08-05 | Discharge: 2017-08-05 | Disposition: A | Discharge status: Home / Self Care | Payer: Medicare Other | Source: Ambulatory Visit | Attending: Cardiovascular Disease | Admitting: Cardiovascular Disease

## 2017-08-05 DIAGNOSIS — R9439 Abnormal result of other cardiovascular function study: Secondary | ICD-10-CM | POA: Diagnosis not present

## 2017-08-05 DIAGNOSIS — I739 Peripheral vascular disease, unspecified: Secondary | ICD-10-CM | POA: Insufficient documentation

## 2017-08-19 ENCOUNTER — Telehealth: Payer: Self-pay | Admitting: Cardiovascular Disease

## 2017-08-19 NOTE — Telephone Encounter (Signed)
Called returned to the patient. There was not any answer and the voicemail was full. Message has been routed to the provider for any recommendations he may have for cardiologists in the [place] area.

## 2017-08-19 NOTE — Telephone Encounter (Signed)
New Message:    Pt is moving to [place] in 2 weeks.He would like Dr Gwenlyn Found to refer him to a Cardiologist in [place] please.

## 2017-08-21 NOTE — Telephone Encounter (Signed)
I would recommend Dr. Egbert Garibaldi at [facility] clinic

## 2017-08-24 IMAGING — DX DG CHEST 2V
2 series · 2 of 2 positions shown · non-contrast
Comparison: 09/23/2016 and 09/22/2016.

CLINICAL DATA: Postop thoracotomy with VATS on 09/19/2016. History
of COPD.

EXAM:
CHEST  2 VIEW

[dg chest 2 view (1 of 2)]
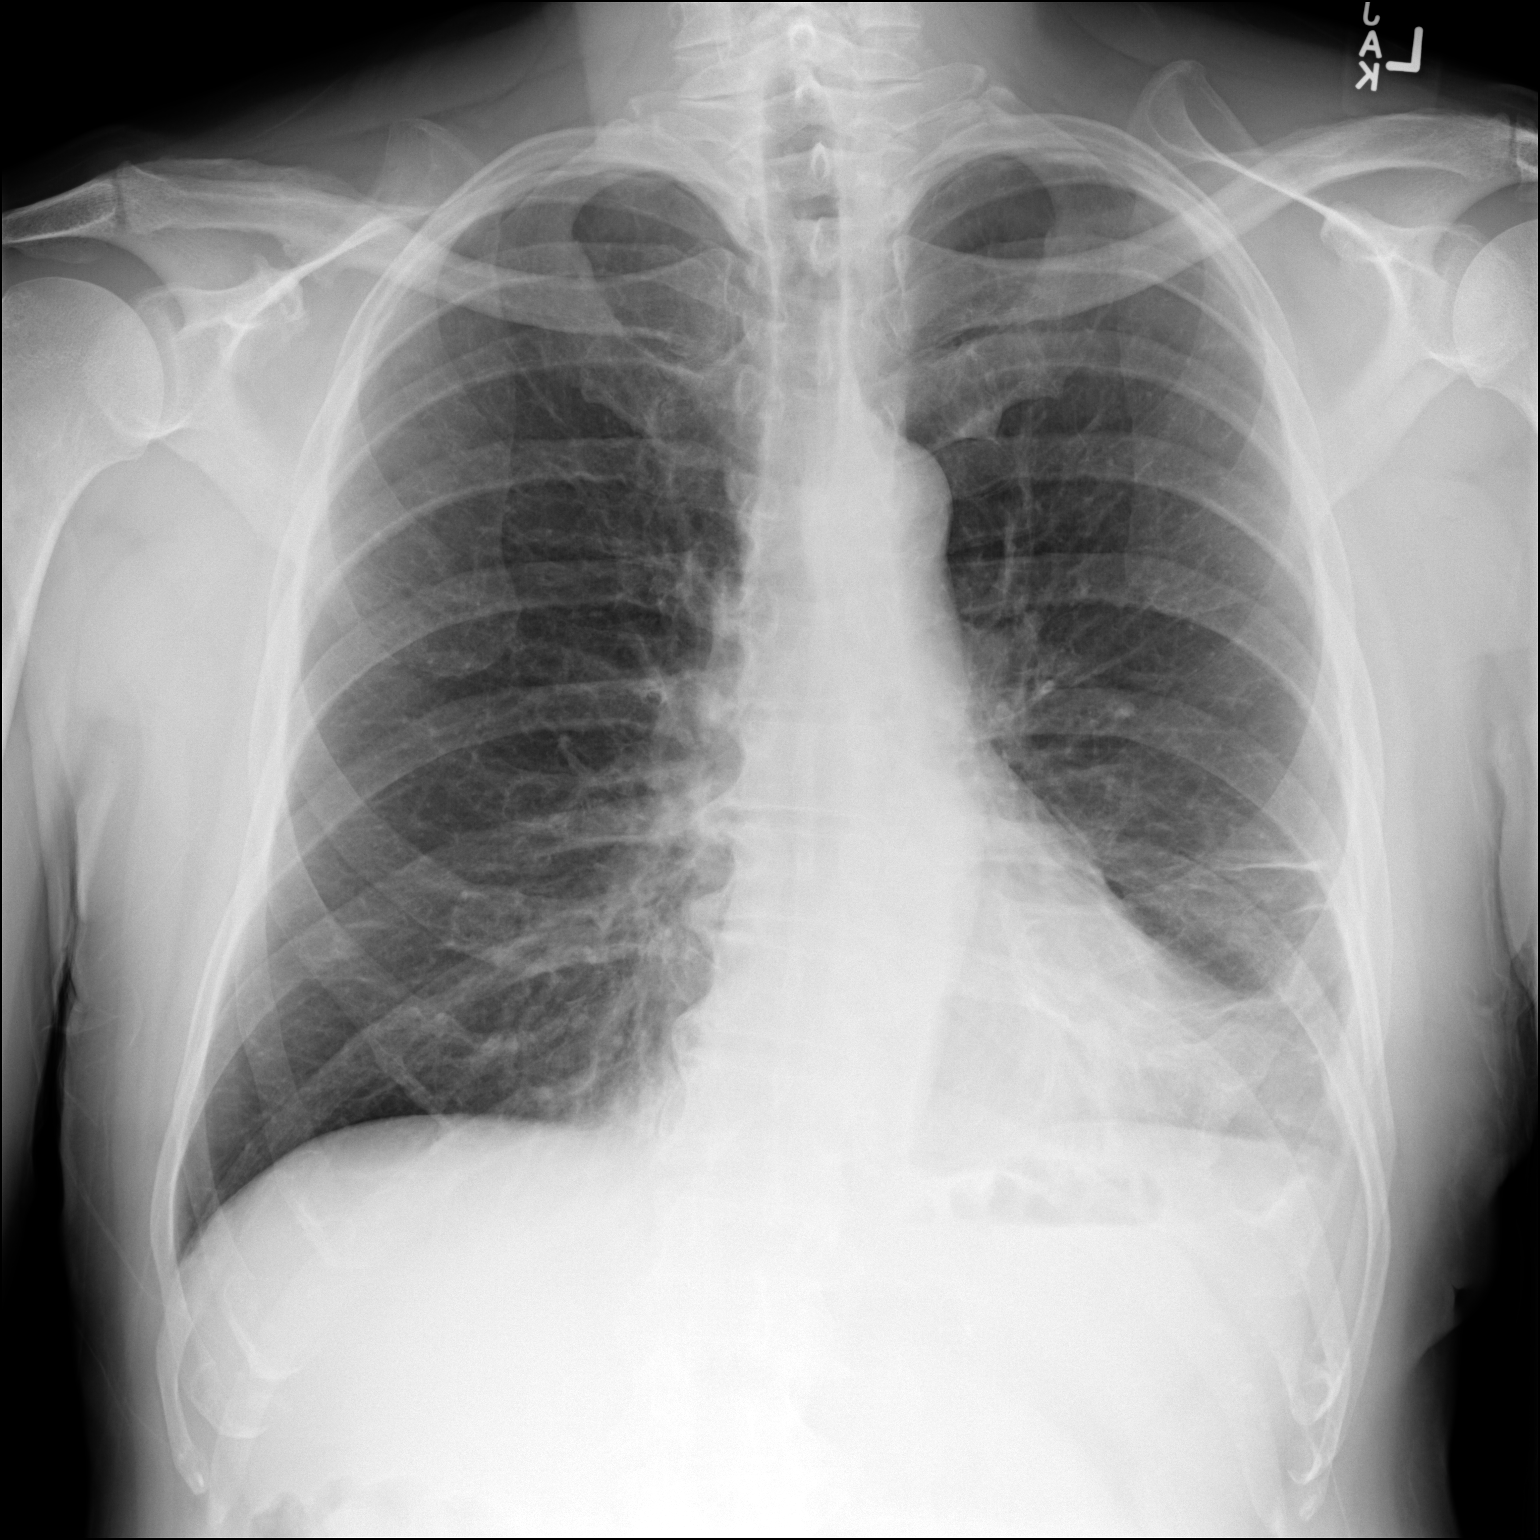

[dg chest 2 view (2 of 2)]
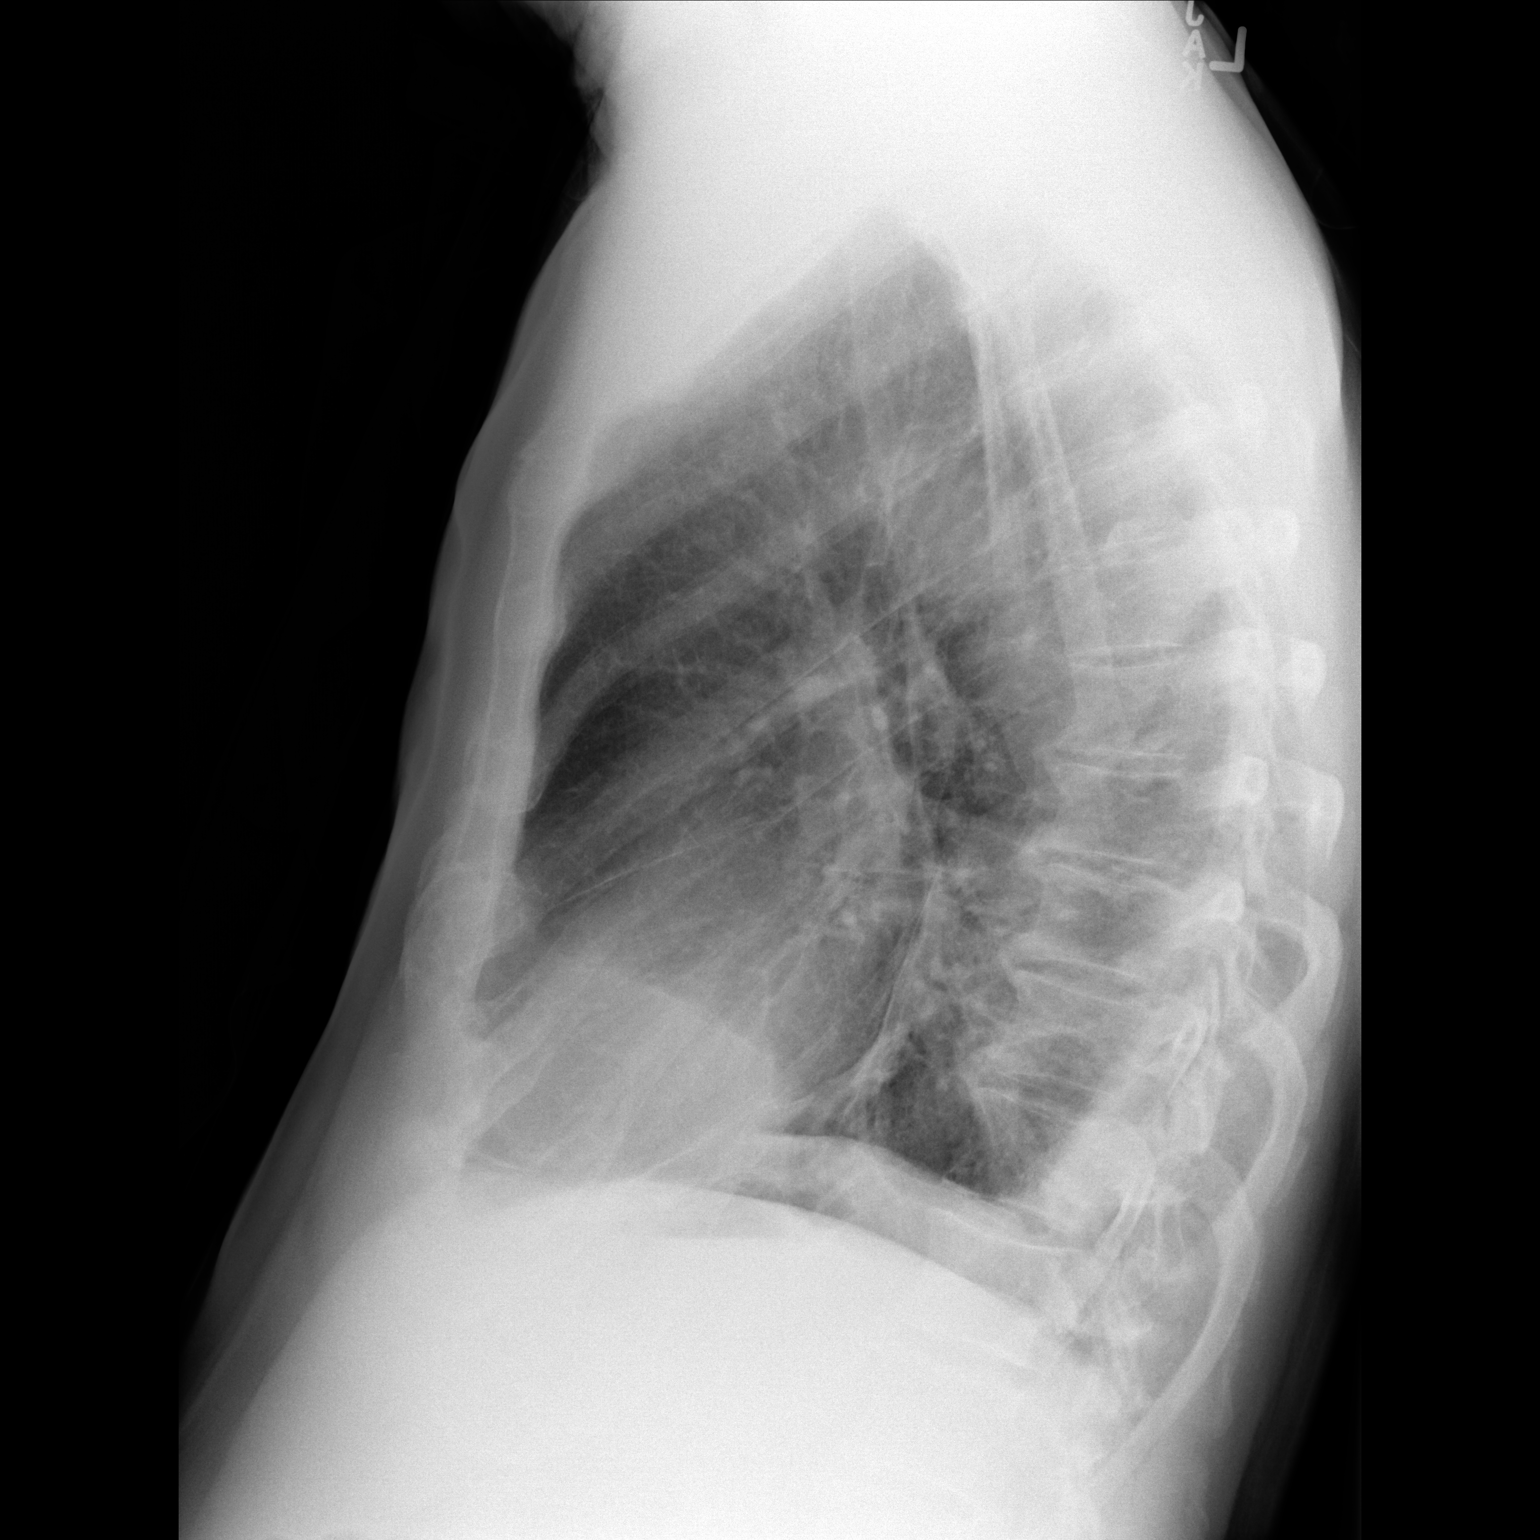

[2 of 2 positions shown; findings below may reference images not displayed]

FINDINGS: The heart size and mediastinal contours are stable. Interval
improvement in loculated pleural effusion on the left and adjacent
left lower lobe aeration. No pneumothorax, confluent airspace
opacity or significant pleural fluid on the right. Thoracic spine
degenerative changes are noted.
IMPRESSION: Improving loculated left pleural effusion and left basilar
atelectasis. No pneumothorax.

## 2017-08-26 ENCOUNTER — Ambulatory Visit (INDEPENDENT_AMBULATORY_CARE_PROVIDER_SITE_OTHER): Payer: Medicare Other | Admitting: Family Medicine

## 2017-08-26 ENCOUNTER — Ambulatory Visit
Admission: RE | Admit: 2017-08-26 | Discharge: 2017-08-26 | Disposition: A | Discharge status: Home / Self Care | Payer: Medicare Other | Source: Ambulatory Visit | Attending: Family Medicine | Admitting: Family Medicine

## 2017-08-26 ENCOUNTER — Encounter: Payer: Self-pay | Admitting: Family Medicine

## 2017-08-26 VITALS — BP 160/84 | HR 100 | Temp 98.1°F | Resp 16 | Ht 74.0 in | Wt 190.0 lb

## 2017-08-26 DIAGNOSIS — J449 Chronic obstructive pulmonary disease, unspecified: Secondary | ICD-10-CM

## 2017-08-26 DIAGNOSIS — I10 Essential (primary) hypertension: Secondary | ICD-10-CM | POA: Diagnosis not present

## 2017-08-26 DIAGNOSIS — R059 Cough, unspecified: Secondary | ICD-10-CM

## 2017-08-26 DIAGNOSIS — E7849 Other hyperlipidemia: Secondary | ICD-10-CM | POA: Diagnosis not present

## 2017-08-26 DIAGNOSIS — Z0000 Encounter for general adult medical examination without abnormal findings: Secondary | ICD-10-CM

## 2017-08-26 DIAGNOSIS — R05 Cough: Secondary | ICD-10-CM | POA: Diagnosis not present

## 2017-08-26 DIAGNOSIS — J439 Emphysema, unspecified: Secondary | ICD-10-CM | POA: Diagnosis not present

## 2017-08-26 DIAGNOSIS — I2510 Atherosclerotic heart disease of native coronary artery without angina pectoris: Secondary | ICD-10-CM

## 2017-08-26 DIAGNOSIS — Z1159 Encounter for screening for other viral diseases: Secondary | ICD-10-CM | POA: Diagnosis not present

## 2017-08-26 DIAGNOSIS — Z125 Encounter for screening for malignant neoplasm of prostate: Secondary | ICD-10-CM | POA: Diagnosis not present

## 2017-08-26 MED ORDER — ALBUTEROL SULFATE HFA 108 (90 BASE) MCG/ACT IN AERS: INHALATION_SPRAY | RESPIRATORY_TRACT | 3 refills | Status: AC

## 2017-08-26 NOTE — Progress Notes (Signed)
Subjective:    Patient ID: Brent Gonzales, male    DOB: Sep 09, 1945, 72 y.o.   MRN: [id number]  HPI Patient is here today for complete physical exam.  I have not seen him since his hospital discharge in June 2018.  At that time he had recently been out of the hospital with pneumonia.  Unfortunately he continues to smoke.  He is up-to-date on Pneumovax 23 and Prevnar 13.  He is also had Zostavax.  However he is due for Shingrix, Tdap.  Patient had a colonoscopy in 2016 that were significant for 3 tubular adenomas.  They repeated a colonoscopy in 2017 which revealed an additional tubular adenoma.  He is due for follow-up in 2020.  He is overdue for a prostate exam today/digital rectal exam.  He is due for hepatitis C screening.  He is also due for fasting lab work.  He has a history of coronary artery disease.  He denies any chest pain, shortness of breath, angina.  His blood pressure today is extremely high however I rechecked it and found to be 120/64.  This is in keeping with blood pressure he has had numerous other doctors offices.  He states that he gets nervous when he walks into a doctor's office.  He is never seen at this time in the past. Past Medical History:  Diagnosis Date  . Aorto-iliac disease ([place])    mild bilateral external. ABIs in the 0.9 range  . Carotid artery disease ([place])    right ICA stenosis  . COPD (chronic obstructive pulmonary disease) ([place])   . Coronary artery disease   . Empyema ([place])   . High cholesterol   . Hyperlipidemia   . Hypertension    no longer- on medications  . Pneumonia 08/2016  . Tobacco abuse    Past Surgical History:  Procedure Laterality Date  . COLONOSCOPY W/ POLYPECTOMY    . DOPPLER ECHOCARDIOGRAPHY    . Duplex ultrasound    . HERNIA REPAIR     left and right  . NM MYOVIEW LTD  03/2010   Inferior septal ischemia  . stress myocardial perfusion    . TONSILLECTOMY AND ADENOIDECTOMY    . VIDEO ASSISTED THORACOSCOPY (VATS)/DECORTICATION Left  09/19/2016   Procedure: LEFT VIDEO ASSISTED THORACOSCOPY (VATS)/DECORTICATION;  Surgeon: Grace Isaac, MD;  Location: [facility];  Service: Thoracic;  Laterality: Left;  Marland Kitchen VIDEO BRONCHOSCOPY N/A 09/19/2016   Procedure: VIDEO BRONCHOSCOPY;  Surgeon: Grace Isaac, MD;  Location[facility];  Service: Thoracic;  Laterality: N/A;   Current Outpatient Medications on File Prior to Visit  Medication Sig Dispense Refill  . acetaminophen (TYLENOL) 500 MG tablet Take 1,000 mg by mouth 2 (two) times daily as needed for mild pain or headache.     Marland Kitchen aspirin 81 MG tablet Take 81 mg by mouth daily.    Marland Kitchen atorvastatin (LIPITOR) 40 MG tablet TAKE 1 TABLET (40 MG TOTAL) BY MOUTH DAILY. 360 tablet 0  . budesonide-formoterol (SYMBICORT) 160-4.5 MCG/ACT inhaler Inhale 2 puffs into the lungs 2 (two) times daily. 1 Inhaler 0  . cetirizine (ZYRTEC) 10 MG tablet Take 10 mg by mouth daily as needed for allergies.    . Cholecalciferol (VITAMIN D) 2000 UNITS CAPS Take 2,000 Units by mouth daily.    . clopidogrel (PLAVIX) 75 MG tablet Take 1 tablet (75 mg total) by mouth daily with breakfast. NEED OV. 90 tablet 0  . Coenzyme Q10 (CO Q 10 PO) Take 200 mg by mouth  daily.    . docusate sodium (COLACE) 100 MG capsule Take 100 mg by mouth daily.    . fluticasone (FLONASE) 50 MCG/ACT nasal spray Place 2 sprays into both nostrils daily as needed for allergies or rhinitis.    Marland Kitchen glucosamine-chondroitin 500-400 MG tablet Take 1 tablet by mouth 2 (two) times daily.    Marland Kitchen KRILL OIL PO Take 1 capsule by mouth daily.    . Multiple Vitamin (MULTIVITAMIN WITH MINERALS) TABS tablet Take 1 tablet by mouth daily.    . pantoprazole (PROTONIX) 40 MG tablet Take 1 tablet (40 mg total) by mouth daily. Take 30-60 min before first meal of the day 90 tablet 0   No current facility-administered medications on file prior to visit.    Allergies  Allergen Reactions  . Penicillins Other (See Comments)    Family history if this allergy Has patient  had a PCN reaction causing immediate rash, facial/tongue/throat swelling, SOB or lightheadedness with hypotension: No Has patient had a PCN reaction causing severe rash involving mucus membranes or skin necrosis: No Has patient had a PCN reaction that required hospitalization: No Has patient had a PCN reaction occurring within the last 10 years: No If all of the above answers are "NO", then may proceed with Cephalosporin use.    Social History   Socioeconomic History  . Marital status: Married    Spouse name: Not on file  . Number of children: Not on file  . Years of education: Not on file  . Highest education level: Not on file  Occupational History  . Not on file  Social Needs  . Financial resource strain: Not on file  . Food insecurity:    Worry: Not on file    Inability: Not on file  . Transportation needs:    Medical: Not on file    Non-medical: Not on file  Tobacco Use  . Smoking status: Current Every Day Smoker    Packs/day: 0.50    Years: 45.00    Pack years: 22.50    Types: Cigarettes  . Smokeless tobacco: Never Used  . Tobacco comment: Had quit 08/21/16 but started smoking again and doing 2-3cigs per day  Substance and Sexual Activity  . Alcohol use: Yes    Alcohol/week: 4.2 oz    Types: 7 Cans of beer per week  . Drug use: No  . Sexual activity: Yes  Lifestyle  . Physical activity:    Days per week: Not on file    Minutes per session: Not on file  . Stress: Not on file  Relationships  . Social connections:    Talks on phone: Not on file    Gets together: Not on file    Attends religious service: Not on file    Active member of club or organization: Not on file    Attends meetings of clubs or organizations: Not on file    Relationship status: Not on file  . Intimate partner violence:    Fear of current or ex partner: Not on file    Emotionally abused: Not on file    Physically abused: Not on file    Forced sexual activity: Not on file  Other Topics  Concern  . Not on file  Social History Narrative  . Not on file   No family history on file.    Review of Systems  All other systems reviewed and are negative.      Objective:   Physical Exam  Constitutional: He  is oriented to person, place, and time. He appears well-developed and well-nourished. No distress.  HENT:  Head: Normocephalic and atraumatic.  Right Ear: External ear normal.  Left Ear: External ear normal.  Nose: Nose normal.  Mouth/Throat: Oropharynx is clear and moist. No oropharyngeal exudate.  Eyes: Pupils are equal, round, and reactive to light. Conjunctivae and EOM are normal. Right eye exhibits no discharge. Left eye exhibits no discharge. No scleral icterus.  Neck: Normal range of motion. Neck supple. No JVD present. No tracheal deviation present. No thyromegaly present.  Cardiovascular: Normal rate, regular rhythm, normal heart sounds and intact distal pulses. Exam reveals no gallop and no friction rub.  No murmur heard. Pulmonary/Chest: Effort normal. No stridor. No respiratory distress. He has no wheezes. He has rales. He exhibits no tenderness.  Abdominal: Soft. Bowel sounds are normal. He exhibits no distension and no mass. There is no tenderness. There is no rebound and no guarding. No hernia.  Musculoskeletal: Normal range of motion. He exhibits no edema, tenderness or deformity.  Lymphadenopathy:    He has no cervical adenopathy.  Neurological: He is alert and oriented to person, place, and time. He displays normal reflexes. No cranial nerve deficit or sensory deficit. He exhibits normal muscle tone. Coordination normal.  Skin: Skin is warm. Capillary refill takes less than 2 seconds. No rash noted. He is not diaphoretic. No erythema. No pallor.  Psychiatric: He has a normal mood and affect. His behavior is normal. Judgment and thought content normal.  Vitals reviewed.         Assessment & Plan:  Benign essential HTN - Plan: CBC with  Differential/Platelet, COMPLETE METABOLIC PANEL WITH GFR, Lipid panel, Hepatitis C Antibody  Prostate cancer screening - Plan: PSA  Encounter for hepatitis C screening test for low risk patient - Plan: Hepatitis C Antibody  Cough - Plan: DG Chest 2 View  Pulmonary emphysema, unspecified emphysema type ([place]) - Plan: albuterol (PROVENTIL HFA;VENTOLIN HFA) 108 (90 Base) MCG/ACT inhaler  Coronary artery disease involving native heart without angina pectoris, unspecified vessel or lesion type  COPD GOLD II   Other hyperlipidemia  General medical exam   Patient's physical exam today is significant for an elevated blood pressure.  Repeat blood pressure was normal.  I have asked the patient to come by later this week and allow [place] to recheck it.  If persistently elevated, I would treat however this is an isolated number that improved after sitting in her exam room for 20 minutes.  Colonoscopy is due again in 2020.  I will screen the patient for prostate cancer today with a PSA.  I will also screen him for hepatitis C.  Given his history of coronary artery disease, I will check a CBC, CMP, fasting lipid panel.  Goal LDL cholesterol is less than 70.  Patient's immunizations are up-to-date although I did recommend shin Grix.  His exam today is significant for bibasilar crackles in both lungs.  I will schedule the patient for a chest x-ray to evaluate further.  He is overdue for ultrasound of the abdomen to evaluate for AAA.  However he is moving to [place] in 1 week and therefore I will defer that to his primary care physician in [place] once he gets established.  He would be due for a flu shot this fall.

## 2017-08-27 LAB — CBC WITH DIFFERENTIAL/PLATELET
BASOS ABS: 52 {cells}/uL (ref 0–200)
Basophils Relative: 0.6 %
Eosinophils Absolute: 522 cells/uL — ABNORMAL HIGH (ref 15–500)
Eosinophils Relative: 6 %
HEMATOCRIT: 46.5 % (ref 38.5–50.0)
Hemoglobin: 16.6 g/dL (ref 13.2–17.1)
Lymphs Abs: 1505 cells/uL (ref 850–3900)
MCH: 33.1 pg — ABNORMAL HIGH (ref 27.0–33.0)
MCHC: 35.7 g/dL (ref 32.0–36.0)
MCV: 92.6 fL (ref 80.0–100.0)
MPV: 10 fL (ref 7.5–12.5)
Monocytes Relative: 7.7 %
NEUTROS PCT: 68.4 %
Neutro Abs: 5951 cells/uL (ref 1500–7800)
PLATELETS: 214 10*3/uL (ref 140–400)
RBC: 5.02 10*6/uL (ref 4.20–5.80)
RDW: 12.3 % (ref 11.0–15.0)
TOTAL LYMPHOCYTE: 17.3 %
WBC mixed population: 670 cells/uL (ref 200–950)
WBC: 8.7 10*3/uL (ref 3.8–10.8)

## 2017-08-27 LAB — LIPID PANEL
CHOLESTEROL: 142 mg/dL (ref <200)
HDL: 34 mg/dL — AB (ref >40)
LDL Cholesterol (Calc): 80 mg/dL (calc)
Non-HDL Cholesterol (Calc): 108 mg/dL (calc) (ref <130)
TRIGLYCERIDES: 181 mg/dL — AB (ref <150)
Total CHOL/HDL Ratio: 4.2 (calc) (ref <5.0)

## 2017-08-27 LAB — COMPLETE METABOLIC PANEL WITH GFR
AG RATIO: 1.3 (calc) (ref 1.0–2.5)
ALKALINE PHOSPHATASE (APISO): 68 U/L (ref 40–115)
ALT: 17 U/L (ref 9–46)
AST: 19 U/L (ref 10–35)
Albumin: 4.2 g/dL (ref 3.6–5.1)
BUN/Creatinine Ratio: 13 (calc) (ref 6–22)
BUN: 16 mg/dL (ref 7–25)
CO2: 24 mmol/L (ref 20–32)
CREATININE: 1.22 mg/dL — AB (ref 0.70–1.18)
Calcium: 9.4 mg/dL (ref 8.6–10.3)
Chloride: 103 mmol/L (ref 98–110)
GFR, EST NON AFRICAN AMERICAN: 59 mL/min/{1.73_m2} — AB (ref >=60)
GFR, Est African American: 68 mL/min/{1.73_m2} (ref >=60)
GLOBULIN: 3.3 g/dL (ref 1.9–3.7)
Glucose, Bld: 74 mg/dL (ref 65–99)
POTASSIUM: 4.5 mmol/L (ref 3.5–5.3)
Sodium: 136 mmol/L (ref 135–146)
Total Bilirubin: 0.5 mg/dL (ref 0.2–1.2)
Total Protein: 7.5 g/dL (ref 6.1–8.1)

## 2017-08-27 LAB — HEPATITIS C ANTIBODY
HEP C AB: NONREACTIVE
SIGNAL TO CUT-OFF: 0.02 (ref <1.00)

## 2017-08-27 LAB — PSA: PSA: 0.6 ng/mL (ref <=4.0)

## 2017-09-03 NOTE — Telephone Encounter (Signed)
Spoke with pt, informtion given.

## 2017-09-17 ENCOUNTER — Telehealth: Payer: Self-pay | Admitting: Cardiovascular Disease

## 2017-09-17 MED ORDER — ATORVASTATIN CALCIUM 40 MG PO TABS: 40.0000 mg | ORAL_TABLET | Freq: Every day | ORAL | 0 refills | Status: AC

## 2017-09-17 MED ORDER — CLOPIDOGREL BISULFATE 75 MG PO TABS: 75.0000 mg | ORAL_TABLET | Freq: Every day | ORAL | 0 refills | Status: AC

## 2017-09-17 NOTE — Telephone Encounter (Signed)
New Message     *STAT* If patient is at the pharmacy, call can be transferred to refill team.   1. Which medications need to be refilled? (please list name of each medication and dose if known)  atorvastatin (LIPITOR) 40 MG tablet TAKE 1 TABLET (40 MG TOTAL) BY MOUTH DAILY.      clopidogrel (PLAVIX) 75 MG tablet Take 1 tablet (75 mg total) by mouth daily with breakfast. NEED OV.        2. Which pharmacy/location (including street and city if local pharmacy) is medication to be sent to?  [facility] [address] [place]  3. Do they need a 30 day or 90 day supply? [place]

## 2017-09-17 NOTE — Telephone Encounter (Signed)
Rx request sent to pharmacy.  

## 2017-09-25 DIAGNOSIS — E559 Vitamin D deficiency, unspecified: Secondary | ICD-10-CM | POA: Diagnosis not present

## 2017-09-25 DIAGNOSIS — N400 Enlarged prostate without lower urinary tract symptoms: Secondary | ICD-10-CM | POA: Diagnosis not present

## 2017-09-25 DIAGNOSIS — N390 Urinary tract infection, site not specified: Secondary | ICD-10-CM | POA: Diagnosis not present

## 2017-09-25 DIAGNOSIS — E7849 Other hyperlipidemia: Secondary | ICD-10-CM | POA: Diagnosis not present

## 2017-09-25 DIAGNOSIS — J449 Chronic obstructive pulmonary disease, unspecified: Secondary | ICD-10-CM | POA: Diagnosis not present

## 2017-09-25 DIAGNOSIS — Z0000 Encounter for general adult medical examination without abnormal findings: Secondary | ICD-10-CM | POA: Diagnosis not present

## 2017-09-25 DIAGNOSIS — I2510 Atherosclerotic heart disease of native coronary artery without angina pectoris: Secondary | ICD-10-CM | POA: Diagnosis not present

## 2017-10-01 DIAGNOSIS — I208 Other forms of angina pectoris: Secondary | ICD-10-CM | POA: Diagnosis not present

## 2017-10-01 DIAGNOSIS — R0989 Other specified symptoms and signs involving the circulatory and respiratory systems: Secondary | ICD-10-CM | POA: Diagnosis not present

## 2017-10-01 DIAGNOSIS — I10 Essential (primary) hypertension: Secondary | ICD-10-CM | POA: Diagnosis not present

## 2017-10-04 DIAGNOSIS — R070 Pain in throat: Secondary | ICD-10-CM | POA: Diagnosis not present

## 2017-10-04 DIAGNOSIS — J343 Hypertrophy of nasal turbinates: Secondary | ICD-10-CM | POA: Diagnosis not present

## 2017-10-04 DIAGNOSIS — J342 Deviated nasal septum: Secondary | ICD-10-CM | POA: Diagnosis not present

## 2017-12-26 ENCOUNTER — Telehealth: Payer: Self-pay | Admitting: Internal Medicine

## 2017-12-26 NOTE — Telephone Encounter (Signed)
Spoke with pt, he states his copay for Symbicort went up in price from 45 dollars to 230 dollars. I called the pharmacy and they stated this was accurate. The pharmacist stated that most insurance companies.   I called [phone] but was unable to speak to someone before closing. Please call in the am to check on pt's benefit for Symbicort.

## 2017-12-27 NOTE — Telephone Encounter (Signed)
ATC [employer] and was placed on hold >31min.  Will call back

## 2017-12-30 NOTE — Telephone Encounter (Signed)
lmtcb for pt. We can offer pt assistance for Symbicort to help with the cost.

## 2017-12-30 NOTE — Telephone Encounter (Signed)
Pt is calling back [phone]

## 2017-12-30 NOTE — Telephone Encounter (Signed)
Spoke with a representative from [employer] and they have stated that the patient is in the doughnut hole and must spend 4,133.14 in medication costs. Most all inhalers will have a high co-pay. I called pt to let him know but I had to leave message for him to call back,

## 2017-12-31 NOTE — Telephone Encounter (Signed)
Called and left message for Patient to call back for Symbicort patient assistance forms.

## 2018-01-01 NOTE — Telephone Encounter (Signed)
Called and spoke with pt letting him know that per his insurance company, all inhalers would have a high copay.  Stated to pt we could offer patient assistance paperwork.  Pt expressed understanding and stated we could send the paperwork for him to look at and he would go from there to see if he wanted to go through with it.  Pt stated to me he recently moved. I read the address we had on file to see if it was the correct address, which it was not. I have received pt's correct address which I have updated in pt's demographics.  I have mailed the patient assistance forms to pt and have placed a note on there to mail back to [place] filled out if he wants to go through with it so we can have Dr. Melvyn Novas sign them.  Nothing further needed.
